# Patient Record
Sex: Female | Born: 1970
Health system: Southern US, Community
[De-identification: ages and names within clinical notes are randomized; demographics above are authoritative.]

## PROBLEM LIST (undated history)

## (undated) DIAGNOSIS — Z87442 Personal history of urinary calculi: Secondary | ICD-10-CM

## (undated) DIAGNOSIS — J40 Bronchitis, not specified as acute or chronic: Secondary | ICD-10-CM

## (undated) DIAGNOSIS — J45909 Unspecified asthma, uncomplicated: Secondary | ICD-10-CM

## (undated) DIAGNOSIS — E042 Nontoxic multinodular goiter: Secondary | ICD-10-CM

## (undated) DIAGNOSIS — K219 Gastro-esophageal reflux disease without esophagitis: Secondary | ICD-10-CM

## (undated) DIAGNOSIS — J302 Other seasonal allergic rhinitis: Secondary | ICD-10-CM

## (undated) DIAGNOSIS — K209 Esophagitis, unspecified without bleeding: Secondary | ICD-10-CM

## (undated) DIAGNOSIS — T7840XA Allergy, unspecified, initial encounter: Secondary | ICD-10-CM

## (undated) DIAGNOSIS — G43909 Migraine, unspecified, not intractable, without status migrainosus: Secondary | ICD-10-CM

## (undated) DIAGNOSIS — E785 Hyperlipidemia, unspecified: Secondary | ICD-10-CM

## (undated) DIAGNOSIS — E0590 Thyrotoxicosis, unspecified without thyrotoxic crisis or storm: Secondary | ICD-10-CM

## (undated) HISTORY — DX: Hyperlipidemia, unspecified: E78.5

## (undated) HISTORY — DX: Gastro-esophageal reflux disease without esophagitis: K21.9

## (undated) HISTORY — DX: Nontoxic multinodular goiter: E04.2

## (undated) HISTORY — DX: Thyrotoxicosis, unspecified without thyrotoxic crisis or storm: E05.90

## (undated) HISTORY — DX: Allergy, unspecified, initial encounter: T78.40XA

---

## 2003-09-13 HISTORY — PX: CHOLECYSTECTOMY: SHX55

## 2014-04-01 ENCOUNTER — Emergency Department
Admission: EM | Admit: 2014-04-01 | Discharge: 2014-04-01 | Disposition: A | Discharge status: Home / Self Care | Payer: 59 | Source: Home / Self Care | Attending: Family Medicine | Admitting: Family Medicine

## 2014-04-01 ENCOUNTER — Encounter: Payer: Self-pay | Admitting: Emergency Medicine

## 2014-04-01 DIAGNOSIS — B9789 Other viral agents as the cause of diseases classified elsewhere: Principal | ICD-10-CM

## 2014-04-01 DIAGNOSIS — J069 Acute upper respiratory infection, unspecified: Secondary | ICD-10-CM

## 2014-04-01 DIAGNOSIS — J209 Acute bronchitis, unspecified: Secondary | ICD-10-CM

## 2014-04-01 HISTORY — DX: Other seasonal allergic rhinitis: J30.2

## 2014-04-01 HISTORY — DX: Unspecified asthma, uncomplicated: J45.909

## 2014-04-01 HISTORY — DX: Bronchitis, not specified as acute or chronic: J40

## 2014-04-01 HISTORY — DX: Migraine, unspecified, not intractable, without status migrainosus: G43.909

## 2014-04-01 MED ORDER — AZITHROMYCIN 250 MG PO TABS: ORAL_TABLET | ORAL | Status: DC

## 2014-04-01 MED ORDER — PREDNISONE 20 MG PO TABS: 20.0000 mg | ORAL_TABLET | Freq: Two times a day (BID) | ORAL | Status: DC

## 2014-04-01 MED ORDER — BENZONATATE 200 MG PO CAPS: 200.0000 mg | ORAL_CAPSULE | Freq: Every day | ORAL | Status: DC

## 2014-04-01 NOTE — Discharge Instructions (Signed)
Continue plain Mucinex (1200 mg guaifenesin) twice daily for cough and congestion.  May add Sudafed for sinus congestion.   Increase fluid intake, rest. May use Afrin nasal spray (or generic oxymetazoline) twice daily for about 5 days.  Also recommend using saline nasal spray several times daily and saline nasal irrigation (AYR is a common brand).  Continue Flonase nasal spray daily after Afrin and saline rinse. Try warm salt water gargles for sore throat.  Stop all antihistamines for now, and other non-prescription cough/cold preparations.

## 2014-04-01 NOTE — ED Provider Notes (Signed)
CSN: [id number]     Arrival date & time 04/01/14  1840 History   First MD Initiated Contact with Patient 04/01/14 1900     Chief Complaint  Patient presents with  . Cough  . Nasal Congestion      HPI Comments: Patient complains of sinus congestion about one week ago that did not respond to Zyrtec and Flonase.  About 6 days ago she developed a non-productive cough and post-nasal drainage but no sore throat.  She has had wheezing, improved with her albuterol inhaler.  She denies fevers, chills, and sweats. She has a history of allergy induced asthma that only flares up seasonally and with viral URI  The history is provided by the patient.    Past Medical History  Diagnosis Date  . Asthma   . Seasonal allergies   . Bronchitis   . Migraine    Past Surgical History  Procedure Laterality Date  . Cholecystectomy     History reviewed. No pertinent family history. History  Substance Use Topics  . Smoking status: Former Research scientist (life sciences)  . Smokeless tobacco: Not on file  . Alcohol Use: Yes   OB History   Grav Para Term Preterm Abortions TAB SAB Ect Mult Living                 Review of Systems No sore throat + hoarseness + cough No pleuritic pain + wheezing + nasal congestion + post-nasal drainage No sinus pain/pressure No itchy/red eyes No earache No hemoptysis No SOB No fever/chills No nausea No vomiting No abdominal pain No diarrhea No urinary symptoms No skin rash + fatigue No myalgias No headache Used OTC meds without relief  Allergies  Review of patient's allergies indicates no known allergies.  Home Medications   Prior to Admission medications   Medication Sig Start Date End Date Taking? Authorizing Provider  albuterol (PROVENTIL) (2.5 MG/3ML) 0.083% nebulizer solution Take 2.5 mg by nebulization every 6 (six) hours as needed for wheezing or shortness of breath.   Yes Historical Provider, MD  cetirizine (ZYRTEC) 10 MG tablet Take 10 mg by mouth daily.   Yes  Historical Provider, MD  fluticasone (FLONASE) 50 MCG/ACT nasal spray Place into both nostrils daily.   Yes Historical Provider, MD  azithromycin (ZITHROMAX Z-PAK) 250 MG tablet Take 2 tabs today; then begin one tab once daily for 4 more days. 04/01/14   Kandra Nicolas, MD  benzonatate (TESSALON) 200 MG capsule Take 1 capsule (200 mg total) by mouth at bedtime. Take as needed for cough 04/01/14   Kandra Nicolas, MD  predniSONE (DELTASONE) 20 MG tablet Take 1 tablet (20 mg total) by mouth 2 (two) times daily. Take with food. 04/01/14   Kandra Nicolas, MD   BP 124/73  Pulse 75  Temp(Src) 98 F (36.7 C) (Oral)  Resp 16  Ht 5\' 4"  (1.626 m)  Wt 145 lb (65.772 kg)  BMI 24.88 kg/m2  SpO2 96%  LMP 03/12/2014 Physical Exam Nursing notes and Vital Signs reviewed. Appearance:  Patient appears healthy, stated age, and in no acute distress Eyes:  Pupils are equal, round, and reactive to light and accomodation.  Extraocular movement is intact.  Conjunctivae are not inflamed  Ears:  Canals normal.  Tympanic membranes normal.  Nose:   Congested turbinates.  No sinus tenderness.   Pharynx:  Normal Neck:  Supple.   Tender enlarged posterior nodes are palpated bilaterally  Lungs:  Faint wheezes right chest; no rales or rhonchi.  Breath sounds are equal.  Heart:  Regular rate and rhythm without murmurs, rubs, or gallops.  Abdomen:  Nontender without masses or hepatosplenomegaly.  Bowel sounds are present.  No CVA or flank tenderness.  Extremities:  No edema.  No calf tenderness Skin:  No rash present.   ED Course  Procedures  none      MDM   1. Viral URI with cough   2. Bronchospasm with bronchitis, acute     Begin prednisone burst and Z-pack to cover atypicals. Prescription written for Benzonatate [facility]) to take at bedtime for night-time cough.  Continue plain Mucinex (1200 mg guaifenesin) twice daily for cough and congestion.  May add Sudafed for sinus congestion.   Increase fluid intake,  rest. May use Afrin nasal spray (or generic oxymetazoline) twice daily for about 5 days.  Also recommend using saline nasal spray several times daily and saline nasal irrigation (AYR is a common brand).  Continue Flonase nasal spray daily after Afrin and saline rinse. Try warm salt water gargles for sore throat.  Stop all antihistamines for now, and other non-prescription cough/cold preparations. Followup with Family Doctor if not improved in one week.     Kandra Nicolas, MD 04/02/14 [phone]

## 2014-04-01 NOTE — ED Notes (Signed)
Reports one week history of cough and congestion; worried about possibility of bronchitis.

## 2014-04-04 ENCOUNTER — Telehealth: Payer: Self-pay | Admitting: Emergency Medicine

## 2014-08-18 ENCOUNTER — Other Ambulatory Visit: Payer: Self-pay | Admitting: Obstetrics and Gynecology

## 2014-08-18 DIAGNOSIS — Z1231 Encounter for screening mammogram for malignant neoplasm of breast: Secondary | ICD-10-CM

## 2014-08-27 ENCOUNTER — Ambulatory Visit (INDEPENDENT_AMBULATORY_CARE_PROVIDER_SITE_OTHER): Payer: 59

## 2014-08-27 DIAGNOSIS — Z1231 Encounter for screening mammogram for malignant neoplasm of breast: Secondary | ICD-10-CM

## 2015-02-13 ENCOUNTER — Other Ambulatory Visit: Payer: Self-pay | Admitting: Internal Medicine

## 2015-02-13 DIAGNOSIS — E041 Nontoxic single thyroid nodule: Secondary | ICD-10-CM

## 2015-02-17 ENCOUNTER — Ambulatory Visit (INDEPENDENT_AMBULATORY_CARE_PROVIDER_SITE_OTHER): Payer: 59

## 2015-02-17 DIAGNOSIS — E041 Nontoxic single thyroid nodule: Secondary | ICD-10-CM

## 2015-03-27 ENCOUNTER — Other Ambulatory Visit (HOSPITAL_COMMUNITY): Payer: Self-pay | Admitting: Internal Medicine

## 2015-03-27 DIAGNOSIS — E041 Nontoxic single thyroid nodule: Secondary | ICD-10-CM

## 2015-04-01 ENCOUNTER — Ambulatory Visit (HOSPITAL_COMMUNITY): Payer: 59

## 2015-04-22 ENCOUNTER — Other Ambulatory Visit: Payer: Self-pay | Admitting: Radiology

## 2015-04-23 ENCOUNTER — Ambulatory Visit (HOSPITAL_COMMUNITY)
Admission: RE | Admit: 2015-04-23 | Discharge: 2015-04-23 | Disposition: A | Discharge status: Home / Self Care | Payer: 59 | Source: Ambulatory Visit | Attending: Internal Medicine | Admitting: Internal Medicine

## 2015-04-23 DIAGNOSIS — E042 Nontoxic multinodular goiter: Secondary | ICD-10-CM | POA: Insufficient documentation

## 2015-04-23 DIAGNOSIS — E041 Nontoxic single thyroid nodule: Secondary | ICD-10-CM

## 2015-04-23 NOTE — Procedures (Signed)
US guided FNA X4 dominant right mid thyroid nodule via 25 gauge needles. Pathology pending. No immediate complications.

## 2015-11-13 MED FILL — methIMAzole 10 MG TABS: 10 | 90 days supply | Qty: 102 | Fill #1

## 2015-11-13 MED FILL — LO LOESTRIN FE 1-10 TABLET: 1 MG-10 MCG | 84 days supply | Qty: 84 | Fill #1

## 2016-02-09 MED FILL — LO LOESTRIN FE 1-10 TABLET: 1 MG-10 MCG | 84 days supply | Qty: 84 | Fill #2

## 2016-02-09 MED FILL — methIMAzole 10 MG TABS: 10 | 90 days supply | Qty: 102 | Fill #2

## 2016-05-06 DIAGNOSIS — Z135 Encounter for screening for eye and ear disorders: Secondary | ICD-10-CM | POA: Diagnosis not present

## 2016-05-06 DIAGNOSIS — H5213 Myopia, bilateral: Secondary | ICD-10-CM | POA: Diagnosis not present

## 2016-05-12 MED FILL — LO LOESTRIN FE 1-10 TABLET: 1 MG-10 MCG | 84 days supply | Qty: 84 | Fill #3

## 2016-05-13 MED FILL — TRIAZOLAM 0.25 MG TABLET: 0.25 | 2 days supply | Qty: 2 | Fill #0

## 2016-09-02 ENCOUNTER — Telehealth: Payer: Self-pay | Admitting: Allergy & Immunology

## 2016-09-02 MED ORDER — AMOXICILLIN-POT CLAVULANATE 875-125 MG PO TABS: 1.0000 | ORAL_TABLET | Freq: Two times a day (BID) | ORAL | 0 refills | Status: AC

## 2016-09-02 NOTE — Telephone Encounter (Signed)
Leslie Valencia talked to me to discuss worsening sinus symptoms. She has had a cold for 1-2 weeks and was feeling improved, however her symptoms acutely worsened overnight. She endorses sinus pressure in bilateral frontal sinuses as well as as green purulent discharge with postnasal drip. She has no history of recurrent sinusitis and she cannot remember when her last course of abx was prescribed. Therefore I will send in a prescription for Augmentin 875mg  tablets twice daily for 14 days. Confirmed pharmacy. Patient has NKDA.  Salvatore Marvel, MD [place] [facility] [place]

## 2016-10-11 MED FILL — methIMAzole 10 MG TABS: 10 | 90 days supply | Qty: 104 | Fill #0

## 2016-11-01 DIAGNOSIS — Z6830 Body mass index (BMI) 30.0-30.9, adult: Secondary | ICD-10-CM | POA: Diagnosis not present

## 2016-11-01 DIAGNOSIS — Z01419 Encounter for gynecological examination (general) (routine) without abnormal findings: Secondary | ICD-10-CM | POA: Diagnosis not present

## 2017-01-17 MED FILL — methIMAzole 10 MG TABS: 10 | 26 days supply | Qty: 31 | Fill #1

## 2017-03-09 DIAGNOSIS — E0590 Thyrotoxicosis, unspecified without thyrotoxic crisis or storm: Secondary | ICD-10-CM | POA: Diagnosis not present

## 2017-03-09 DIAGNOSIS — E041 Nontoxic single thyroid nodule: Secondary | ICD-10-CM | POA: Diagnosis not present

## 2017-03-09 DIAGNOSIS — Z0000 Encounter for general adult medical examination without abnormal findings: Secondary | ICD-10-CM | POA: Diagnosis not present

## 2017-03-10 ENCOUNTER — Other Ambulatory Visit: Payer: Self-pay

## 2017-03-10 MED ORDER — MONTELUKAST SODIUM 10 MG PO TABS: 10.0000 mg | ORAL_TABLET | Freq: Every day | ORAL | 5 refills | Status: DC

## 2017-03-27 ENCOUNTER — Other Ambulatory Visit: Payer: Self-pay

## 2017-03-27 MED ORDER — MONTELUKAST SODIUM 10 MG PO TABS: 10.0000 mg | ORAL_TABLET | Freq: Every day | ORAL | 5 refills | Status: DC

## 2017-03-27 MED FILL — MONTELUKAST SOD 10 MG TAB: 10 | 90 days supply | Qty: 90 | Fill #0

## 2017-03-28 MED FILL — methIMAzole 10 MG TABS: 10 | 84 days supply | Qty: 96 | Fill #0

## 2017-05-05 ENCOUNTER — Telehealth: Payer: Self-pay | Admitting: Allergy & Immunology

## 2017-05-05 MED ORDER — TRIAMCINOLONE ACETONIDE 0.5 % EX CREA: 1.0000 "application " | TOPICAL_CREAM | Freq: Three times a day (TID) | CUTANEOUS | 0 refills | Status: DC

## 2017-05-05 MED ORDER — LIDOCAINE VISCOUS 2 % MT SOLN: 20.0000 mL | OROMUCOSAL | 0 refills | Status: DC | PRN

## 2017-05-05 NOTE — Telephone Encounter (Signed)
Patient called requesting refills for a topical steroid and oral lidocaine solution. Prescriptions sent in.  Salvatore Marvel, MD [facility] and [place] [facility] [place]

## 2017-06-12 ENCOUNTER — Telehealth: Payer: Self-pay | Admitting: Allergy & Immunology

## 2017-06-12 MED ORDER — AMOXICILLIN-POT CLAVULANATE 875-125 MG PO TABS: 1.0000 | ORAL_TABLET | Freq: Two times a day (BID) | ORAL | 0 refills | Status: AC

## 2017-06-12 NOTE — Telephone Encounter (Signed)
Ms. Coover called me to discuss acute onset sinus pressure and fever (100.8). She endorses sinus pressure in bilateral frontal sinuses as well as as green purulent discharge with postnasal drip. She has no history of recurrent sinusitis. Her last course of sinusitis was in December 2017. Therefore I will send in a prescription for Augmentin 875mg  tablets twice daily for 14 days. Confirmed pharmacy. Patient has NKDA.  Salvatore Marvel, MD [place] [facility] [place]

## 2017-06-15 DIAGNOSIS — H5213 Myopia, bilateral: Secondary | ICD-10-CM | POA: Diagnosis not present

## 2017-07-14 MED FILL — methIMAzole 10 MG TABS: 10 | 84 days supply | Qty: 96 | Fill #1

## 2017-08-17 ENCOUNTER — Other Ambulatory Visit: Payer: Self-pay

## 2017-08-17 MED ORDER — AMOXICILLIN-POT CLAVULANATE 875-125 MG PO TABS: 1.0000 | ORAL_TABLET | Freq: Two times a day (BID) | ORAL | 0 refills | Status: AC

## 2017-10-24 MED FILL — methIMAzole 10 MG TABS: 10 | 84 days supply | Qty: 96 | Fill #2

## 2017-11-09 ENCOUNTER — Telehealth: Payer: Self-pay | Admitting: Allergy & Immunology

## 2017-11-09 MED ORDER — AMOXICILLIN-POT CLAVULANATE 875-125 MG PO TABS: 1.0000 | ORAL_TABLET | Freq: Two times a day (BID) | ORAL | 0 refills | Status: AC

## 2017-11-09 MED FILL — AMOX TR-K CLV 875-125 MG TA: 875-125 | 10 days supply | Qty: 20 | Fill #0

## 2017-11-09 NOTE — Telephone Encounter (Signed)
Patient called to let me know of worsening sinus pressure and nasal discharge. Symptoms improved and then worsened acutely. Last abx course was Augmentin in October 2018. I will send in another course of this. Patient in agreement with the plan.   Salvatore Marvel, MD [facility] and [place] [facility] [place]

## 2017-11-10 MED ORDER — PREDNISONE 10 MG PO TABS: ORAL_TABLET | ORAL | 0 refills | Status: DC

## 2017-11-10 NOTE — Addendum Note (Signed)
Addended by: Valentina Shaggy on: 11/10/2017 12:51 PM   Modules accepted: Orders

## 2017-12-05 DIAGNOSIS — Z0000 Encounter for general adult medical examination without abnormal findings: Secondary | ICD-10-CM | POA: Diagnosis not present

## 2017-12-05 DIAGNOSIS — Z6830 Body mass index (BMI) 30.0-30.9, adult: Secondary | ICD-10-CM | POA: Diagnosis not present

## 2017-12-05 DIAGNOSIS — Z01419 Encounter for gynecological examination (general) (routine) without abnormal findings: Secondary | ICD-10-CM | POA: Diagnosis not present

## 2017-12-05 LAB — CBC AND DIFFERENTIAL
HCT: 45 (ref 36–46)
Hemoglobin: 15.7 (ref 12.0–16.0)
Platelets: 228 (ref 150–399)
WBC: 5.7

## 2017-12-05 LAB — HM PAP SMEAR

## 2017-12-05 LAB — HEPATIC FUNCTION PANEL
ALT: 30 (ref 7–35)
AST: 21 (ref 13–35)

## 2017-12-05 LAB — CBC: RBC: 4.84 (ref 3.87–5.11)

## 2017-12-05 LAB — BASIC METABOLIC PANEL
BUN: 9 (ref 4–21)
Creatinine: 0.8 (ref 0.5–1.1)

## 2017-12-05 LAB — LIPID PANEL
Cholesterol: 310 — AB (ref 0–200)
HDL: 67 (ref 35–70)
LDL Cholesterol: 210
Triglycerides: 166 — AB (ref 40–160)

## 2017-12-05 LAB — TSH: TSH: 1.3 (ref 0.41–5.90)

## 2018-01-22 MED FILL — methIMAzole 10 MG TABS: 10 | 84 days supply | Qty: 96 | Fill #3

## 2018-02-14 ENCOUNTER — Telehealth: Payer: Self-pay | Admitting: Allergy & Immunology

## 2018-02-14 DIAGNOSIS — E785 Hyperlipidemia, unspecified: Secondary | ICD-10-CM

## 2018-02-14 DIAGNOSIS — B999 Unspecified infectious disease: Secondary | ICD-10-CM

## 2018-02-14 DIAGNOSIS — J310 Chronic rhinitis: Secondary | ICD-10-CM

## 2018-02-14 MED ORDER — AMOXICILLIN-POT CLAVULANATE 875-125 MG PO TABS: 1.0000 | ORAL_TABLET | Freq: Two times a day (BID) | ORAL | 0 refills | Status: AC

## 2018-02-14 NOTE — Telephone Encounter (Signed)
Ms. Bacorn contacted me to let me know that she is having two weeks of marked congestion, sinus pressure, and a brain fog. She is using all of the OTC treatments including nasal saline rinses without improvement in her symptoms.   Last abx course was Augmentin in February 2019. I will send in another course of Augmentin 875mg  for ten days. I will also order environmental allergy blood work.   Salvatore Marvel, MD [facility] and [place] [facility] [place]

## 2018-02-22 NOTE — Telephone Encounter (Signed)
Patient reports that she completed her antibiotic and she is not having any improvement. She reports clear discharge with some sinus pressure. We discuss options and she decided to go with the addition of a nasal steroid. I also ordered some screening labs to rule out immune deficiencies.  Salvatore Marvel, MD [facility] [place] [facility] [place]

## 2018-02-22 NOTE — Addendum Note (Signed)
Addended by: Valentina Shaggy on: 02/22/2018 12:47 PM   Modules accepted: Orders

## 2018-03-08 NOTE — Telephone Encounter (Signed)
Placed order for repeat lipid panel to be collected at the same time as the other labs.   Salvatore Marvel, MD [facility] and [place] [facility] [place]

## 2018-03-08 NOTE — Addendum Note (Signed)
Addended by: Valentina Shaggy on: 03/08/2018 01:36 PM   Modules accepted: Orders

## 2018-03-13 DIAGNOSIS — E785 Hyperlipidemia, unspecified: Secondary | ICD-10-CM | POA: Diagnosis not present

## 2018-03-21 LAB — IGE+ALLERGENS ZONE 2(30)
Alternaria Alternata IgE: <0.1 kU/L
Amer Sycamore IgE Qn: <0.1 kU/L
Aspergillus Fumigatus IgE: <0.1 kU/L
Bermuda Grass IgE: <0.1 kU/L
Cat Dander IgE: <0.1 kU/L
Cedar, Mountain IgE: <0.1 kU/L
Cladosporium Herbarum IgE: <0.1 kU/L
Cockroach, American IgE: <0.1 kU/L
D Farinae IgE: 0.26 kU/L — AB
D Pteronyssinus IgE: 0.21 kU/L — AB
Dog Dander IgE: <0.1 kU/L
Elm, American IgE: <0.1 kU/L
Hickory, White IgE: <0.1 kU/L
IGE (IMMUNOGLOBULIN E), SERUM: 10 [IU]/mL (ref 6–495)
Maple/Box Elder IgE: <0.1 kU/L
Mucor Racemosus IgE: <0.1 kU/L
Mugwort IgE Qn: <0.1 kU/L
Nettle IgE: <0.1 kU/L
Oak, White IgE: <0.1 kU/L
Penicillium Chrysogen IgE: <0.1 kU/L
Pigweed, Rough IgE: <0.1 kU/L
Plantain, English IgE: <0.1 kU/L
Sheep Sorrel IgE Qn: <0.1 kU/L
Sweet gum IgE RAST Ql: <0.1 kU/L
White Mulberry IgE: <0.1 kU/L

## 2018-03-21 LAB — STREP PNEUMONIAE 23 SEROTYPES IGG
PNEUMO AB TYPE 34 (10A): 0.4 ug/mL — AB (ref >1.3)
PNEUMO AB TYPE 43 (11A): 1.1 ug/mL — AB (ref >1.3)
Pneumo Ab Type 1*: <0.1 ug/mL — ABNORMAL LOW (ref >1.3)
Pneumo Ab Type 14*: 0.7 ug/mL — ABNORMAL LOW (ref >1.3)
Pneumo Ab Type 17 (17F)*: 0.2 ug/mL — ABNORMAL LOW (ref >1.3)
Pneumo Ab Type 19 (19F)*: 0.6 ug/mL — ABNORMAL LOW (ref >1.3)
Pneumo Ab Type 20*: 0.2 ug/mL — ABNORMAL LOW (ref >1.3)
Pneumo Ab Type 22 (22F)*: 0.2 ug/mL — ABNORMAL LOW (ref >1.3)
Pneumo Ab Type 23 (23F)*: 0.9 ug/mL — ABNORMAL LOW (ref >1.3)
Pneumo Ab Type 26 (6B)*: <0.1 ug/mL — ABNORMAL LOW (ref >1.3)
Pneumo Ab Type 3*: 0.4 ug/mL — ABNORMAL LOW (ref >1.3)
Pneumo Ab Type 5*: <0.2 ug/mL — ABNORMAL LOW (ref >1.3)
Pneumo Ab Type 51 (7F)*: 0.1 ug/mL — ABNORMAL LOW (ref >1.3)
Pneumo Ab Type 54 (15B)*: 0.4 ug/mL — ABNORMAL LOW (ref >1.3)
Pneumo Ab Type 56 (18C)*: 0.3 ug/mL — ABNORMAL LOW (ref >1.3)
Pneumo Ab Type 57 (19A)*: 0.6 ug/mL — ABNORMAL LOW (ref >1.3)
Pneumo Ab Type 70 (33F)*: 0.1 ug/mL — ABNORMAL LOW (ref >1.3)
Pneumo Ab Type 8*: <0.1 ug/mL — ABNORMAL LOW (ref >1.3)
Pneumo Ab Type 9 (9N)*: <0.1 ug/mL — ABNORMAL LOW (ref >1.3)

## 2018-03-21 LAB — LIPID PANEL
CHOLESTEROL TOTAL: 293 mg/dL — AB (ref 100–199)
Chol/HDL Ratio: 4.5 ratio — ABNORMAL HIGH (ref 0.0–4.4)
HDL: 65 mg/dL (ref >39)
LDL CALC: 192 mg/dL — AB (ref 0–99)
Triglycerides: 179 mg/dL — ABNORMAL HIGH (ref 0–149)
VLDL Cholesterol Cal: 36 mg/dL (ref 5–40)

## 2018-03-21 LAB — IGG, IGA, IGM
IGG (IMMUNOGLOBIN G), SERUM: 751 mg/dL (ref 700–1600)
IGM (IMMUNOGLOBULIN M), SRM: 110 mg/dL (ref 26–217)
IgA/Immunoglobulin A, Serum: 70 mg/dL — ABNORMAL LOW (ref 87–352)

## 2018-03-21 LAB — DIPHTHERIA / TETANUS ANTIBODY PANEL: Tetanus Ab, IgG: 1.65 IU/mL (ref <0.10)

## 2018-03-21 MED ORDER — LIDOCAINE VISCOUS HCL 2 % MT SOLN: 15.0000 mL | OROMUCOSAL | 2 refills | Status: DC | PRN

## 2018-03-21 NOTE — Addendum Note (Signed)
Addended by: Valentina Shaggy on: 03/21/2018 09:40 AM   Modules accepted: Orders

## 2018-04-12 ENCOUNTER — Ambulatory Visit: Payer: 59 | Admitting: Family Medicine

## 2018-04-12 ENCOUNTER — Encounter: Payer: Self-pay | Admitting: Family Medicine

## 2018-04-12 VITALS — BP 117/85 | HR 79 | Temp 98.7°F | Resp 20 | Ht 64.0 in | Wt 154.5 lb

## 2018-04-12 DIAGNOSIS — E663 Overweight: Secondary | ICD-10-CM

## 2018-04-12 DIAGNOSIS — E0590 Thyrotoxicosis, unspecified without thyrotoxic crisis or storm: Secondary | ICD-10-CM | POA: Diagnosis not present

## 2018-04-12 DIAGNOSIS — G43709 Chronic migraine without aura, not intractable, without status migrainosus: Secondary | ICD-10-CM | POA: Diagnosis not present

## 2018-04-12 DIAGNOSIS — Z7689 Persons encountering health services in other specified circumstances: Secondary | ICD-10-CM

## 2018-04-12 DIAGNOSIS — E782 Mixed hyperlipidemia: Secondary | ICD-10-CM

## 2018-04-12 DIAGNOSIS — E042 Nontoxic multinodular goiter: Secondary | ICD-10-CM

## 2018-04-12 DIAGNOSIS — Z789 Other specified health status: Secondary | ICD-10-CM | POA: Diagnosis not present

## 2018-04-12 DIAGNOSIS — R490 Dysphonia: Secondary | ICD-10-CM

## 2018-04-12 MED ORDER — AMITRIPTYLINE HCL 25 MG PO TABS: ORAL_TABLET | ORAL | 0 refills | Status: DC

## 2018-04-12 MED ORDER — ATORVASTATIN CALCIUM 20 MG PO TABS: 20.0000 mg | ORAL_TABLET | Freq: Every day | ORAL | 1 refills | Status: DC

## 2018-04-12 MED ORDER — ISOMETHEPTENE-DICHLORAL-APAP 65-100-325 MG PO CAPS: ORAL_CAPSULE | ORAL | 5 refills | Status: DC

## 2018-04-12 NOTE — Patient Instructions (Addendum)
I ordered a thyroid US--> they will call to schedule  Start liptior--> follow up with me in 3 months for lipid recheck  Start amitriptyline before bed, start 25 mg about 1 hour before bed. If after 1 week you feel you could use a higher dose (but not too sedated)--> increase to 50 mg before bed.  I will also call in [place] for you.    Please help [place] help you:  We are honored you have chosen [facility] for your Primary Care home. Below you will find basic instructions that you may need to access in the future. Please help [place] help you by reading the instructions, which cover many of the frequent questions we experience.   Prescription refills and request:  -In order to allow more efficient response time, please call your pharmacy for all refills. They will forward the request electronically to [place]. This allows for the quickest possible response. Request left on a nurse line can take longer to refill, since these are checked as time allows between office patients and other phone calls.  - refill request can take up to 3-5 working days to complete.  - If request is sent electronically and request is appropiate, it is usually completed in 1-2 business days.  - all patients will need to be seen routinely for all chronic medical conditions requiring prescription medications (see follow-up below). If you are overdue for follow up on your condition, you will be asked to make an appointment and we will call in enough medication to cover you until your appointment (up to 30 days).  - all controlled substances will require a face to face visit to request/refill.  - if you desire your prescriptions to go through a new pharmacy, and have an active script at original pharmacy, you will need to call your pharmacy and have scripts transferred to new pharmacy. This is completed between the pharmacy locations and not by your provider.    Results: If any images or labs were ordered, it can take up to 1 week to  get results depending on the test ordered and the lab/facility running and resulting the test. - Normal or stable results, which do not need further discussion, may be released to your mychart immediately with attached note to you. A call may not be generated for normal results. Please make certain to sign up for mychart. If you have questions on how to activate your mychart you can call the front office.  - If your results need further discussion, our office will attempt to contact you via phone, and if unable to reach you after 2 attempts, we will release your abnormal result to your mychart with instructions.  - All results will be automatically released in mychart after 1 week.  - Your provider will provide you with explanation and instruction on all relevant material in your results. Please keep in mind, results and labs may appear confusing or abnormal to the untrained eye, but it does not mean they are actually abnormal for you personally. If you have any questions about your results that are not covered, or you desire more detailed explanation than what was provided, you should make an appointment with your provider to do so.   Our office handles many outgoing and incoming calls daily. If we have not contacted you within 1 week about your results, please check your mychart to see if there is a message first and if not, then contact our office.  In helping with  this matter, you help decrease call volume, and therefore allow [place] to be able to respond to patients needs more efficiently.   Acute office visits (sick visit):  An acute visit is intended for a new problem and are scheduled in shorter time slots to allow schedule openings for patients with new problems. This is the appropriate visit to discuss a new problem. Problems will not be addressed by phone call or Echart message. Appointment is needed if requesting treatment. In order to provide you with excellent quality medical care with proper time  for you to explain your problem, have an exam and receive treatment with instructions, these appointments should be limited to one new problem per visit. If you experience a new problem, in which you desire to be addressed, please make an acute office visit, we save openings on the schedule to accommodate you. Please do not save your new problem for any other type of visit, let us take care of it properly and quickly for you.   Follow up visits:  Depending on your condition(s) your provider will need to see you routinely in order to provide you with quality care and prescribe medication(s). Most chronic conditions (Example: hypertension, Diabetes, depression/anxiety... etc), require visits a couple times a year. Your provider will instruct you on proper follow up for your personal medical conditions and history. Please make certain to make follow up appointments for your condition as instructed. Failing to do so could result in lapse in your medication treatment/refills. If you request a refill, and are overdue to be seen on a condition, we will always provide you with a 30 day script (once) to allow you time to schedule.    Medicare wellness (well visit): - we have a wonderful Nurse Maudie Mercury), that will meet with you and provide you will yearly medicare wellness visits. These visits should occur yearly (can not be scheduled less than 1 calendar year apart) and cover preventive health, immunizations, advance directives and screenings you are entitled to yearly through your medicare benefits. Do not miss out on your entitled benefits, this is when medicare will pay for these benefits to be ordered for you.  These are strongly encouraged by your provider and is the appropriate type of visit to make certain you are up to date with all preventive health benefits. If you have not had your medicare wellness exam in the last 12 months, please make certain to schedule one by calling the office and schedule your medicare  wellness with Maudie Mercury as soon as possible.   Yearly physical (well visit):  - Adults are recommended to be seen yearly for physicals. Check with your insurance and date of your last physical, most insurances require one calendar year between physicals. Physicals include all preventive health topics, screenings, medical exam and labs that are appropriate for gender/age and history. You may have fasting labs needed at this visit. This is a well visit (not a sick visit), new problems should not be covered during this visit (see acute visit).  - Pediatric patients are seen more frequently when they are younger. Your provider will advise you on well child visit timing that is appropriate for your their age. - This is not a medicare wellness visit. Medicare wellness exams do not have an exam portion to the visit. Some medicare companies allow for a physical, some do not allow a yearly physical. If your medicare allows a yearly physical you can schedule the medicare wellness with our nurse Maudie Mercury and have your  physical with your provider after, on the same day. Please check with insurance for your full benefits.   Late Policy/No Shows:  - all new patients should arrive 15-30 minutes earlier than appointment to allow [place] time  to  obtain all personal demographics,  insurance information and for you to complete office paperwork. - All established patients should arrive 10-15 minutes earlier than appointment time to update all information and be checked in .  - In our best efforts to run on time, if you are late for your appointment you will be asked to either reschedule or if able, we will work you back into the schedule. There will be a wait time to work you back in the schedule,  depending on availability.  - If you are unable to make it to your appointment as scheduled, please call 24 hours ahead of time to allow [place] to fill the time slot with someone else who needs to be seen. If you do not cancel your appointment  ahead of time, you may be charged a no show fee.

## 2018-04-12 NOTE — Progress Notes (Signed)
[name], female  DOB: 10-May-1971, 47 y.o.   MRN: [id number] Patient Care Team    Relationship Specialty Notifications Start End  Ma Hillock, DO PCP - General Family Medicine  04/12/18   Charisse March, MD Referring Physician Obstetrics and Gynecology  04/12/18   Sarina Ser, MD Referring Physician Endocrinology  04/12/18     Chief Complaint  Patient presents with  . Establish Care  . Hyperlipidemia  . Migraine    focal pain left side of head    Subjective:  Leslie Valencia is a 47 y.o.  female present for new patient establishment. All past medical history, surgical history, allergies, family history, immunizations, medications and social history were updated in the electronic medical record today. All recent labs, ED visits and hospitalizations within the last year were reviewed.  Migraine: Pt reports a long history of migraines. She has gone through many different medications and can not tolerate narcotics of any type. Anaphylaxis to some. She was tried on topamax a long time ago, but it was not effective. She has 3-5 headaches a week. Usually can get by with MSAIDS if taken soon enough. She has also used Midrin in the past and it was helpful for bad migraines. She endorses also experiencing more recenlty occasion left posterior auricular/occiptal sharp stabbing pan that only last 2-3 seconds. She denies nausea, vomit, photophobia. She does not currently have a migraine.   Hyperlipidemia: Elevated panel x3. Last below. Reviewed prior in care everywhere. She has made a great deal of changes in her diet. Went mostly vegetarian. Watches saturated fats. Exercised. Started omega-3 (vegan) 900 mg.  She reports her LDL only came down 18 points. Fhx of heart disease in [place]. Former smoker.  Lipid Panel     Component Value Date/Time   CHOL 293 (H) 03/13/2018 0930   TRIG 179 (H) 03/13/2018 0930   HDL 65 03/13/2018 0930   CHOLHDL 4.5 (H) 03/13/2018 0930   LDLCALC 192 (H) 03/13/2018 0930     Hyperthyroidism/thyroid cyst/multinodular goiter.:  Reviewed in care everywhere her last endocrine appt. She is currently on tapazole 5 mg.  Her endocrinologist is retiring and she will eventually need a referral. Today she states her neck is more swollen. She feels the nodules/cyst has grown a great deal since work up 3 years ago. Her thyroid panel at her GYN office 5 months ago pt reports was normal. She is complaining of voice changes and hoarseness. She feels her thyroid is pressing on her throat.Marland Kitchen History copied from last Endo appt: " Suppressed TSH of 0.09, normal T4 of 8.5 on 02/13/15. Negative thyroid peroxidase antibody of 14 (0-34), negative thyroglobulin  antibody, negative thyrotropin receptor antibody. Thyroid US 02/2015: large Rt lobe, 7.1 cm in length, total volume  18.2 cc, with a large 2.6-cm mass in the lower pole; normal  sized Lt lobe, 2.6 cm in length, total volume 3.3 cc. US-guided FNA 02/2015 at [facility], benign.  Methimazole since 02/16/15."   Depression screen PHQ 2/9 04/12/2018  Decreased Interest 0  Down, Depressed, Hopeless 0  PHQ - 2 Score 0   No flowsheet data found.    Current Exercise Habits: Home exercise routine, Type of exercise: walking(swimming,running), Time (Minutes): 60, Frequency (Times/Week): 4, Weekly Exercise (Minutes/Week): 240, Intensity: Moderate Exercise limited by: None identified Fall Risk  04/12/2018  Falls in the past year? No      There is no immunization history on file for this patient.  No exam data present  Past Medical History:  Diagnosis Date  . Allergy   . Asthma   . Bronchitis   . GERD (gastroesophageal reflux disease)   . Hyperlipidemia   . Hyperthyroidism   . Migraine   . Multinodular goiter   . Seasonal allergies    Allergies  Allergen Reactions  . Morphine And Related Anaphylaxis  . Mushroom Extract Complex Anaphylaxis  . Norco [Hydrocodone-Acetaminophen]   .  Tramadol    Past Surgical History:  Procedure Laterality Date  . CHOLECYSTECTOMY  2005   Family History  Problem Relation Age of Onset  . Alcohol abuse Father   . Arthritis Father   . Depression Father   . Early death Father   . Heart disease Maternal Grandmother   . Hyperlipidemia Maternal Grandmother    Social History   Socioeconomic History  . Marital status: Married    Spouse name: Not on file  . Number of children: Not on file  . Years of education: Not on file  . Highest education level: Not on file  Occupational History  . Not on file  Social Needs  . Financial resource strain: Not on file  . Food insecurity:    Worry: Not on file    Inability: Not on file  . Transportation needs:    Medical: Not on file    Non-medical: Not on file  Tobacco Use  . Smoking status: Former Research scientist (life sciences)  . Smokeless tobacco: Never Used  Substance and Sexual Activity  . Alcohol use: Yes  . Drug use: No  . Sexual activity: Yes    Partners: Male    Comment: married  Lifestyle  . Physical activity:    Days per week: Not on file    Minutes per session: Not on file  . Stress: Not on file  Relationships  . Social connections:    Talks on phone: Not on file    Gets together: Not on file    Attends religious service: Not on file    Active member of club or organization: Not on file    Attends meetings of clubs or organizations: Not on file    Relationship status: Not on file  . Intimate partner violence:    Fear of current or ex partner: Not on file    Emotionally abused: Not on file    Physically abused: Not on file    Forced sexual activity: Not on file  Other Topics Concern  . Not on file  Social History Narrative   Marital status/children/pets: married, 2 children.    Education/employment: Programmer, applications:      -Wears a bicycle helmet riding a bike: Yes     -smoke alarm in the home:Yes     - wears seatbelt: Yes     - Feels safe in their relationships: Yes   Allergies as of  04/12/2018      Reactions   Morphine And Related Anaphylaxis   Mushroom Extract Complex Anaphylaxis   Norco [hydrocodone-acetaminophen]    Tramadol       Medication List        Accurate as of 04/12/18 12:52 PM. Always use your most recent med list.          amitriptyline 25 MG tablet Commonly known as:  ELAVIL 25 mg QHS for 7 days, then 50 mg QHS   atorvastatin 20 MG tablet Commonly known as:  LIPITOR Take 1 tablet (20 mg total) by mouth daily.  cetirizine 10 MG tablet Commonly known as:  ZYRTEC Take 10 mg by mouth daily.   fexofenadine 60 MG tablet Commonly known as:  ALLEGRA Take 60 mg by mouth 2 (two) times daily.   isometheptene-acetaminophen-dichloralphenazone 65-100-325 MG capsule Commonly known as:  MIDRIN 1 cap at onset of headache. May repeat dose in 1 hour. Max: 5 caps in 24 hours   lidocaine 2 % solution Commonly known as:  XYLOCAINE Use as directed 15 mLs in the mouth or throat as needed for mouth pain.   methimazole 5 MG tablet Commonly known as:  TAPAZOLE Take 5 mg by mouth 3 (three) times daily.   omeprazole 20 MG capsule Commonly known as:  PRILOSEC Take 20 mg by mouth daily.   triamcinolone cream 0.5 % Commonly known as:  KENALOG Apply 1 application topically 3 (three) times daily.       All past medical history, surgical history, allergies, family history, immunizations andmedications were updated in the EMR today and reviewed under the history and medication portions of their EMR.    Recent Results (from the past 2160 hour(s))  Lipid Profile     Status: Abnormal   Collection Time: 03/13/18  9:30 AM  Result Value Ref Range   Cholesterol, Total 293 (H) 100 - 199 mg/dL   Triglycerides 179 (H) 0 - 149 mg/dL   HDL 65 >39 mg/dL   VLDL Cholesterol Cal 36 5 - 40 mg/dL   LDL Calculated 192 (H) 0 - 99 mg/dL   Comment: Comment     Comment: Possible Familial Hypercholesterolemia. FH should be suspected when fasting LDL cholesterol is above 189  mg/dL or non-HDL cholesterol is above 219 mg/dL. A family history of high cholesterol and heart disease in 1st degree relatives should be collected. J Clin Lipidol 2011;5:133-140    Chol/HDL Ratio 4.5 (H) 0.0 - 4.4 ratio    Comment:                                   T. Chol/HDL Ratio                                             Men  Women                               1/2 Avg.Risk  3.4    3.3                                   Avg.Risk  5.0    4.4                                2X Avg.Risk  9.6    7.1                                3X Avg.Risk 23.4   11.0   Allergens Zone 2     Status: Abnormal   Collection Time: 03/13/18  9:30 AM  Result Value Ref Range   Class Description Comment     Comment:  Levels of Specific IgE       Class  Description of Class     ---------------------------  -----  --------------------                    < 0.10         0         Negative            0.10 -    0.31         0/I       Equivocal/Low            0.32 -    0.55         I         Low            0.56 -    1.40         II        Moderate            1.41 -    3.90         III       High            3.91 -   19.00         IV        Very High           19.01 -  100.00         V         Very High                   >100.00         VI        Very High    IgE (Immunoglobulin E), Serum 10 6 - 495 IU/mL    Comment:               **Please note reference interval change**   D Pteronyssinus IgE 0.21 (A) Class 0/I kU/L   D Farinae IgE 0.26 (A) Class 0/I kU/L   Cat Dander IgE <0.10 Class 0 kU/L   Dog Dander IgE <0.10 Class 0 kU/L   [place] Grass IgE <0.10 Class 0 kU/L   Timothy Grass IgE <0.10 Class 0 kU/L   Johnson Grass IgE <0.10 Class 0 kU/L   Bahia Grass IgE <0.10 Class 0 kU/L   Cockroach, American IgE <0.10 Class 0 kU/L   Penicillium Chrysogen IgE <0.10 Class 0 kU/L   Cladosporium Herbarum IgE <0.10 Class 0 kU/L   Aspergillus Fumigatus IgE <0.10 Class 0 kU/L   Mucor Racemosus IgE <0.10 Class 0 kU/L    Alternaria Alternata IgE <0.10 Class 0 kU/L   Stemphylium Herbarum IgE <0.10 Class 0 kU/L   Common Silver Wendee Copp IgE <0.10 Class 0 kU/L   Oak, White IgE <0.10 Class 0 kU/L   Elm, American IgE <0.10 Class 0 kU/L   Maple/Box Elder IgE <0.10 Class 0 kU/L   Hickory, White IgE <0.10 Class 0 kU/L   Amer Sycamore IgE Qn <0.10 Class 0 kU/L   White Mulberry IgE <0.10 Class 0 kU/L   Sweet gum IgE RAST Ql <0.10 Class 0 kU/L   Cedar, [place] IgE <0.10 Class 0 kU/L   Ragweed, Short IgE <0.10 Class 0 kU/L   Mugwort IgE Qn <0.10 Class 0 kU/L   Plantain, English IgE <0.10 Class 0 kU/L   Pigweed, Rough IgE <0.10 Class 0 kU/L   Sheep Sorrel  IgE Qn <0.10 Class 0 kU/L   Nettle IgE <0.10 Class 0 kU/L  Strep pneumoniae 23 Serotypes IgG     Status: Abnormal   Collection Time: 03/13/18  9:30 AM  Result Value Ref Range   Pneumo Ab Type 1* <0.1 (L) >1.3 ug/mL   Pneumo Ab Type 3* 0.4 (L) >1.3 ug/mL   Pneumo Ab Type 4* <0.1 (L) >1.3 ug/mL   Pneumo Ab Type 8* <0.1 (L) >1.3 ug/mL   Pneumo Ab Type 9 (9N)* <0.1 (L) >1.3 ug/mL   Pneumo Ab Type 12 (81F)* <0.1 (L) >1.3 ug/mL   Pneumo Ab Type 14* 0.7 (L) >1.3 ug/mL   Pneumo Ab Type 17 (62F)* 0.2 (L) >1.3 ug/mL   Pneumo Ab Type 19 (59F)* 0.6 (L) >1.3 ug/mL   Pneumo Ab Type 2* <0.1 (L) >1.3 ug/mL   Pneumo Ab Type 20* 0.2 (L) >1.3 ug/mL   Pneumo Ab Type 22 (52F)* 0.2 (L) >1.3 ug/mL   Pneumo Ab Type 23 (520F)* 0.9 (L) >1.3 ug/mL   Pneumo Ab Type 26 (6B)* <0.1 (L) >1.3 ug/mL   Pneumo Ab Type 34 (10A)* 0.4 (L) >1.3 ug/mL   Pneumo Ab Type 43 (11A)* 1.1 (L) >1.3 ug/mL   Pneumo Ab Type 5* <0.2 (L) >1.3 ug/mL   Pneumo Ab Type 51 (20F)* 0.1 (L) >1.3 ug/mL   Pneumo Ab Type 54 (15B)* 0.4 (L) >1.3 ug/mL   Pneumo Ab Type 56 (18C)* 0.3 (L) >1.3 ug/mL   Pneumo Ab Type 57 (19A)* 0.6 (L) >1.3 ug/mL   Pneumo Ab Type 68 (9V)* <0.1 (L) >1.3 ug/mL   Pneumo Ab Type 70 (39F)* 0.1 (L) >1.3 ug/mL    Comment: *This test was developed and its performance characteristics determined by  [employer]. It has not been cleared or approved by the U.S. Food and Drug Administration.   IgG, IgA, IgM     Status: Abnormal   Collection Time: 03/13/18  9:30 AM  Result Value Ref Range   IgG (Immunoglobin G), Serum 751 700 - 1,600 mg/dL   IgA/Immunoglobulin A, Serum 70 (L) 87 - 352 mg/dL   IgM (Immunoglobulin M), Srm 110 26 - 217 mg/dL  Diphtheria / Tetanus Antibody Panel     Status: Abnormal   Collection Time: 03/13/18  9:30 AM  Result Value Ref Range   Tetanus Ab, IgG 1.65 <0.10 IU/mL    Comment:                              Interpretation:                                Non-Protective    <0.10                                Protective       >=0.10 Results for this test are for research purposes only by the assay's manufacturer.  The performance characteristics of this product have not been established.  Results should not be used as a diagnostic procedure without confirmation of the diagnosis by another medically established diagnostic product or procedure.    Diphtheria Ab <0.10 (L) <0.10 IU/mL    Comment:  Interpretation:                                Non-Protective    <0.10                                Protective       >=0.10 For research use only.     ROS: 14 pt review of systems performed and negative (unless mentioned in an HPI)  Objective: BP 117/85 (BP Location: Left Arm, Patient Position: Sitting, Cuff Size: Normal)   Pulse 79   Temp 98.7 F (37.1 C)   Resp 20   Ht 5\' 4"  (1.626 m)   Wt 154 lb 8 oz (70.1 kg)   SpO2 97%   BMI 26.52 kg/m  Gen: Afebrile. No acute distress. Nontoxic in appearance, well-developed, well-nourished,  Very pleasant caucasian female.  HENT: AT. [place]. MMM, . no Cough on exam,  hoarseness present on exam. Eyes:Pupils Equal Round Reactive to light, Extraocular movements intact,  Conjunctiva without redness, discharge or icterus. Neck/lymp/endocrine: Supple,no lymphadenopathy, severely enlarge  thyroid R>L  CV: RRR no murmur, no edema, +2/4 P posterior tibialis pulses.  Chest: CTAB, no wheeze, rhonchi or crackles.  Abd: Soft. NTND. BS present. no  Skin:Warm and well-perfused. Skin intact. Neuro/Msk:  Normal gait. PERLA. EOMi. Alert. Oriented x3.  Psych: Normal affect, dress and demeanor. Normal speech. Normal thought content and judgment.   Assessment/plan: Leslie Valencia is a 47 y.o. female present for est care.  Hyperthyroidism/Multinodular goiter/ Hoarseness New compression symptoms and hoarseness. Obtain US for comparison and likely send to surgeon for consideration on removal.  - eventually may need new endocrine as well since hers is retiring. Will await [place] results before deciding best action plan.  - Continue: methimazole (TAPAZOLE) 5 MG tablet supplied by endo - US THYROID; Future - F/U dependent on [place]   Chronic migraine without aura without status migrainosus, not intractable - discussed multiple options with her today. Given new neuritis type symptoms and 5 hdx days a week, she is willing to try amitriptyline QHS (25 -50 mg QHS tapering instruction provided).  - Midrin prescribed for bad headaches to be used sparingly. [facility] database reviewed 04/12/18 - amitriptyline (ELAVIL) 25 MG tablet; 25 mg QHS for 7 days, then 50 mg QHS  Dispense: 180 tablet; Refill: 0 - isometheptene-acetaminophen-dichloralphenazone (MIDRIN) 65-100-325 MG capsule; 1 cap at onset of headache. May repeat dose in 1 hour. Max: 5 caps in 24 hours  Dispense: 30 capsule; Refill: 5 - F/u 3 months.   Overweight (BMI 25.0-29.9)/Mixed hyperlipidemia - continue exercise and diet. Doing well.  - continue vegan Omega 3.  - start lipitor with LDL > 190.  - atorvastatin (LIPITOR) 20 MG tablet; Take 1 tablet (20 mg total) by mouth daily.  Dispense: 90 tablet; Refill: 1 - F/U 3  Mos with repeat lipid/lft with provider.      Return in about 3 months (around 07/13/2018).   Note is dictated utilizing  voice recognition software. Although note has been proof read prior to signing, occasional typographical errors still can be missed. If any questions arise, please do not hesitate to call for verification.  Electronically signed by: Howard Pouch, DO [place]

## 2018-04-17 ENCOUNTER — Telehealth: Payer: Self-pay

## 2018-04-17 MED ORDER — DICLOFENAC POTASSIUM(MIGRAINE) 50 MG PO PACK: 50.0000 mg | PACK | Freq: Once | ORAL | 2 refills | Status: DC | PRN

## 2018-04-17 NOTE — Telephone Encounter (Signed)
Inform pt Midrin is no longer made.  I have called in diclofenac powder that is to be used once (w/in 24 hours) for migraines. It usually is not covered by insurance, but we can try. Since she is allergic to many other option or has had failed therapy (imitrex and Maxalt)

## 2018-04-17 NOTE — Telephone Encounter (Signed)
Fax received from pharmacy stating that Midrin is no longer made and to please change Rx.

## 2018-04-18 NOTE — Telephone Encounter (Signed)
Message left on voice mail for patient to return call. Okay for [facility] nurse to inform patient and obtain information. See note.

## 2018-04-18 NOTE — Telephone Encounter (Signed)
Pt given information per Dr Raoul Pitch; " inform pt Midrin is no longer made. I have called in diclofenac powder that is to be used once (w/in 24 hours) for migraines.  It usually is not covered by insurance, but we can try.Since she is allergic to many other option or had failed therapy (imitrex and Maxalt)"; also informed pt the prescription was sent to [facility] [address] [place], [place] on 04/17/18; also reviewd prescription instructions with pt, " Take 50 mg by mouth once as needed for up to 1 dose"; she verbalizes understanding.

## 2018-04-25 ENCOUNTER — Other Ambulatory Visit: Payer: 59

## 2018-04-26 ENCOUNTER — Ambulatory Visit (INDEPENDENT_AMBULATORY_CARE_PROVIDER_SITE_OTHER): Payer: 59

## 2018-04-26 DIAGNOSIS — R490 Dysphonia: Secondary | ICD-10-CM

## 2018-04-26 DIAGNOSIS — E0590 Thyrotoxicosis, unspecified without thyrotoxic crisis or storm: Secondary | ICD-10-CM

## 2018-04-26 DIAGNOSIS — E042 Nontoxic multinodular goiter: Secondary | ICD-10-CM

## 2018-04-27 ENCOUNTER — Telehealth: Payer: Self-pay | Admitting: Family Medicine

## 2018-04-27 DIAGNOSIS — M542 Cervicalgia: Secondary | ICD-10-CM

## 2018-04-27 DIAGNOSIS — R490 Dysphonia: Secondary | ICD-10-CM

## 2018-04-27 DIAGNOSIS — E042 Nontoxic multinodular goiter: Secondary | ICD-10-CM

## 2018-04-27 NOTE — Telephone Encounter (Signed)
Please inform patient the following information: Her thyroid US is stable overall. The larger of her nodules is slightly larger (2.6 cm --> 2.9 cm), but appears benign and since bx was benign no further bx was recommended. However,she did report hoarseness and compression symptoms of her enlarged thyroid, therefore I have referred her to surgery to see if they feel she is a candidate for thyroidectomy and a new endocrine to obtain opinion and management of her thyroid disease.    - also, please ask her if she has enough tapazole or if she needs refills and clarify the dose/frequency with her and update med list (even if she does not need refills).

## 2018-04-30 ENCOUNTER — Other Ambulatory Visit: Payer: Self-pay

## 2018-04-30 DIAGNOSIS — E0590 Thyrotoxicosis, unspecified without thyrotoxic crisis or storm: Secondary | ICD-10-CM

## 2018-04-30 MED ORDER — METHIMAZOLE 10 MG PO TABS: ORAL_TABLET | ORAL | 0 refills | Status: DC

## 2018-04-30 NOTE — Telephone Encounter (Signed)
Patient notified and verbalized understanding. Refill request sent to Dr. Raoul Pitch.

## 2018-05-15 ENCOUNTER — Encounter: Payer: Self-pay | Admitting: Family Medicine

## 2018-05-15 ENCOUNTER — Telehealth: Payer: Self-pay | Admitting: Allergy & Immunology

## 2018-05-15 DIAGNOSIS — G43709 Chronic migraine without aura, not intractable, without status migrainosus: Secondary | ICD-10-CM

## 2018-05-15 DIAGNOSIS — E782 Mixed hyperlipidemia: Secondary | ICD-10-CM

## 2018-05-15 MED ORDER — ATORVASTATIN CALCIUM 20 MG PO TABS: 20.0000 mg | ORAL_TABLET | Freq: Every day | ORAL | 2 refills | Status: DC

## 2018-05-15 MED ORDER — AMITRIPTYLINE HCL 25 MG PO TABS: ORAL_TABLET | ORAL | 2 refills | Status: DC

## 2018-05-15 MED ORDER — METHIMAZOLE 10 MG PO TABS: ORAL_TABLET | ORAL | 2 refills | Status: DC

## 2018-05-15 MED FILL — ATORVASTATIN CALCIUM 20 MG: 20 | 90 days supply | Qty: 90 | Fill #0

## 2018-05-15 MED FILL — AMITRIPTYLINE HCL 25 MG TAB: 25 | 90 days supply | Qty: 180 | Fill #0

## 2018-05-15 MED FILL — methIMAzole 10 MG TABS: 10 | 85 days supply | Qty: 98 | Fill #0

## 2018-05-15 NOTE — Telephone Encounter (Signed)
Sent in 90 day scripts to [facility].   Salvatore Marvel, MD [facility]

## 2018-05-31 ENCOUNTER — Ambulatory Visit: Payer: 59 | Admitting: Internal Medicine

## 2018-06-14 ENCOUNTER — Ambulatory Visit: Payer: Self-pay | Admitting: General Surgery

## 2018-06-14 DIAGNOSIS — E041 Nontoxic single thyroid nodule: Secondary | ICD-10-CM | POA: Diagnosis not present

## 2018-06-14 NOTE — H&P (Signed)
History of Present Illness Ralene Ok MD; 06/14/2018 9:48 AM) The patient is a 47 year old female who presents with a thyroid nodule. Referred by:Dr. Raoul Pitch Chief Complaint: Thyroid goiter  Patient is a 47 year old female with a approximate 10 year history of thyroid goiter. Patient was on long-term treatment with methimazole. Patient has had her thyroid cyst followed with biopsies over this. Time. Up until approximately 1-2 years ago patient had no symptoms. Patient states that over this last year. Time she said some dysphagia. She states this has progressed. She states that she has not converted to an mainly vegetarian diet to avoid meats. Patient states that she has had no issues with laying down. She does notice that over the last 6 months she's noticed some hoarseness. This is new. She has had an issue with allergies recently and is unsure if her hoarseness is due to this.  Patient did have a biopsy of a right-sided cyst which revealed normal benign follicular cells.  Patient states that she does not do well and is allergic to opiates. Patient takes ibuprofen for pain.   Past Surgical History Lindwood Coke, RN; 06/14/2018 9:31 AM) Gallbladder Surgery - Open   Allergies (Diane Herrin, RN; 06/14/2018 9:36 AM) Morphine Derivatives  Anaphylaxis. NO NARCOTICS = SEIZURES AND ANAPHYLAXIS Allergies Reconciled   Medication History (Diane Herrin, RN; 06/14/2018 9:37 AM) Amitriptyline HCl (25MG  Tablet, Oral) Active. Atorvastatin Calcium (20MG  Tablet, Oral) Active. methIMAzole (10MG  Tablet, Oral) Active. ZyrTEC (1MG /ML Syrup, Oral) Active. Allegra Allergy (60MG  Tablet, Oral) Active. Omeprazole (20MG  Capsule DR, Oral) Active. Medications Reconciled  Social History Lindwood Coke, RN; 06/14/2018 9:31 AM) Alcohol use  Occasional alcohol use. Caffeine use  Carbonated beverages. No drug use  Tobacco use  Former smoker.  Family History Lindwood Coke, RN; 06/14/2018  9:31 AM) Alcohol Abuse  Father. Heart disease in female family member before age 8  Migraine Headache  Mother.  Pregnancy / Birth History Lindwood Coke, RN; 06/14/2018 9:31 AM) Durenda Age  2 Maternal age  97-30  Other Problems Lindwood Coke, RN; 06/14/2018 9:31 AM) Cholelithiasis  Gastroesophageal Reflux Disease  Hypercholesterolemia  Kidney Stone  Thyroid Disease   Note:No Narcotics - anaphylaxis and seizures    Review of Systems Ralene Ok MD; 06/14/2018 9:46 AM) General Present- Night Sweats. Not Present- Appetite Loss, Chills, Fatigue, Fever, Weight Gain and Weight Loss. All other systems negative  Vitals (Diane Herrin RN; 06/14/2018 9:38 AM) 06/14/2018 9:37 AM Weight: 161.38 lb Height: 65in Body Surface Area: 1.81 m Body Mass Index: 26.85 kg/m  Temp.: 98.48F(Oral)  Pulse: 103 (Regular)  P.OX: 93% (Room air) BP: 126/78 (Sitting, Left Arm, Standard)       Physical Exam Ralene Ok MD; 06/14/2018 9:49 AM) The physical exam findings are as follows: Note:Constitutional: No acute distress, conversant, appears stated age  Eyes: Anicteric sclerae, moist conjunctiva, no lid lag  Neck: trachea midline, no cervical lymphadenopathy, thyroid enlargement bilaterally, right lower pole cyst easily palpable.  Lungs: Clear to auscultation biilaterally, normal respiratory effot  Cardiovascular: regular rate & rhythm, no murmurs, no peripheal edema, pedal pulses 2+  GI: Soft, no masses or hepatosplenomegaly, non-tender to palpation  MSK: Normal gait, no clubbing cyanosis, edema  Skin: No rashes, palpation reveals normal skin turgor  Psychiatric: Appropriate judgment and insight, oriented to person, place, and time    Assessment & Plan Ralene Ok MD; 06/14/2018 9:51 AM) THYROID NODULE (E04.1) Impression: Patient is a 47 year old female with history of migraines, and long-term thyroid goiter. Patient had fairly benign biopsies  in the  past. She has some dysphagia recently and secondary to her dysphagia and recent hoarseness she would like to have her thyroid removed. I did discuss with her this could be partial versus total thyroidectomy, and she decided on total thyroidectomy.  1. Secondary to her recent hoarseness we'll have her evaluated by Dr. Benjamine Mola, to rule out any local nerve palsies. 2. We will schedule her for total thyroidectomy. 3. All risks and benefits were discussed with the patient to generally include: infection, bleeding, damage to the recurrent laryngeal nerve, damage to parathyroid glands, and possible need for further surgery. Alternatives were offered and described. All questions were answered and the patient voiced understanding of the procedure and wishes to proceed.

## 2018-07-31 DIAGNOSIS — K219 Gastro-esophageal reflux disease without esophagitis: Secondary | ICD-10-CM | POA: Diagnosis not present

## 2018-07-31 DIAGNOSIS — R490 Dysphonia: Secondary | ICD-10-CM | POA: Diagnosis not present

## 2018-08-03 MED FILL — OMEPRAZOLE 20 MG CPDR: 20 | 90 days supply | Qty: 90 | Fill #0

## 2018-08-03 MED FILL — FAMOTIDINE 20 MG TABLET: 20 | 90 days supply | Qty: 90 | Fill #0

## 2018-08-14 NOTE — Pre-Procedure Instructions (Signed)
Leslie Valencia  08/14/2018      [place], [place] [address] Phone: [phone] Fax: [phone]  [id number] - [address] [place] [address] [place] [address] [place] [address] Phone: [phone] Fax: [phone]    Your procedure is scheduled on 08/22/2018.  Report to [facility] Admitting at [place].M.  Call this number if you have problems the morning of surgery:  [phone]   Remember:  Do not eat after midnight the night before your surgery.  You may drink clear liquids until 0530 .  Clear liquids allowed are: Water, Juice (non-citric and without pulp), Carbonated beverages, Clear Tea, Black Coffee only and Gatorade    Take these medicines the morning of surgery with A SIP OF WATER : Cetirizine (Zyrtec) Omeprazole (Prilosec)  7 days prior to surgery STOP taking any Aspirin (unless otherwise instructed by your surgeon), Aleve, Naproxen, Ibuprofen, Motrin, Advil, Goody's, BC's, all herbal medications, fish oil, and all vitamins     Do not wear jewelry, make-up or nail polish.  Do not wear lotions, powders, or perfumes, or deodorant.  Do not shave 48 hours prior to surgery.   Do not bring valuables to the hospital.  [facility] is not responsible for any belongings or valuables.  Contacts, eyeglasses, hearing aids, dentures or bridgework may not be worn into surgery.  Leave your suitcase in the car.  After surgery it may be brought to your room.  For patients admitted to the hospital, discharge time will be determined by your treatment team.  Patients discharged the day of surgery will not be allowed to drive home.   Name and phone number of your driver:    Special instructions:   [facility]- Preparing For Surgery  Before surgery, you can play an important role. Because skin is not sterile, your skin needs to be as free of germs as  possible. You can reduce the number of germs on your skin by washing with CHG (chlorahexidine gluconate) Soap before surgery.  CHG is an antiseptic cleaner which kills germs and bonds with the skin to continue killing germs even after washing.    Oral Hygiene is also important to reduce your risk of infection.  Remember - BRUSH YOUR TEETH THE MORNING OF SURGERY WITH YOUR REGULAR TOOTHPASTE  Please do not use if you have an allergy to CHG or antibacterial soaps. If your skin becomes reddened/irritated stop using the CHG.  Do not shave (including legs and underarms) for at least 48 hours prior to first CHG shower. It is OK to shave your face.  Please follow these instructions carefully.   1. Shower the NIGHT BEFORE SURGERY and the MORNING OF SURGERY with CHG.   2. If you chose to wash your hair, wash your hair first as usual with your normal shampoo.  3. After you shampoo, rinse your hair and body thoroughly to remove the shampoo.  4. Use CHG as you would any other liquid soap. You can apply CHG directly to the skin and wash gently with a scrungie or a clean washcloth.   5. Apply the CHG Soap to your body ONLY FROM THE NECK DOWN.  Do not use on open wounds or open sores. Avoid contact with your eyes, ears, mouth and genitals (private parts). Wash Face and genitals (private parts)  with your normal soap.  6. Wash thoroughly, paying special attention  to the area where your surgery will be performed.  7. Thoroughly rinse your body with warm water from the neck down.  8. DO NOT shower/wash with your normal soap after using and rinsing off the CHG Soap.  9. Pat yourself dry with a CLEAN TOWEL.  10. Wear CLEAN PAJAMAS to bed the night before surgery, wear comfortable clothes the morning of surgery  11. Place CLEAN SHEETS on your bed the night of your first shower and DO NOT SLEEP WITH PETS.    Day of Surgery: Shower as stated above. Do not apply any deodorants/lotions.  Please wear clean  clothes to the hospital/surgery center.   Remember to brush your teeth WITH YOUR REGULAR TOOTHPASTE.    Please read over the following fact sheets that you were given.

## 2018-08-15 ENCOUNTER — Other Ambulatory Visit: Payer: Self-pay

## 2018-08-15 ENCOUNTER — Encounter (HOSPITAL_COMMUNITY)
Admission: RE | Admit: 2018-08-15 | Discharge: 2018-08-15 | Disposition: A | Discharge status: Home / Self Care | Payer: 59 | Source: Ambulatory Visit | Attending: General Surgery | Admitting: General Surgery

## 2018-08-15 ENCOUNTER — Ambulatory Visit (HOSPITAL_COMMUNITY)
Admission: RE | Admit: 2018-08-15 | Discharge: 2018-08-15 | Disposition: A | Discharge status: Home / Self Care | Payer: 59 | Source: Ambulatory Visit | Attending: Anesthesiology | Admitting: Anesthesiology

## 2018-08-15 ENCOUNTER — Encounter (HOSPITAL_COMMUNITY): Payer: Self-pay

## 2018-08-15 DIAGNOSIS — Z01811 Encounter for preprocedural respiratory examination: Secondary | ICD-10-CM | POA: Insufficient documentation

## 2018-08-15 DIAGNOSIS — Z01818 Encounter for other preprocedural examination: Secondary | ICD-10-CM | POA: Diagnosis not present

## 2018-08-15 DIAGNOSIS — Z87891 Personal history of nicotine dependence: Secondary | ICD-10-CM | POA: Diagnosis not present

## 2018-08-15 HISTORY — DX: Esophagitis, unspecified without bleeding: K20.90

## 2018-08-15 HISTORY — DX: Esophagitis, unspecified: K20.9

## 2018-08-15 HISTORY — DX: Personal history of urinary calculi: Z87.442

## 2018-08-15 LAB — CBC
HCT: 47.2 % — ABNORMAL HIGH (ref 36.0–46.0)
Hemoglobin: 15.5 g/dL — ABNORMAL HIGH (ref 12.0–15.0)
MCH: 31.1 pg (ref 26.0–34.0)
MCHC: 32.8 g/dL (ref 30.0–36.0)
MCV: 94.6 fL (ref 80.0–100.0)
Platelets: 219 10*3/uL (ref 150–400)
RBC: 4.99 MIL/uL (ref 3.87–5.11)
RDW: 11.8 % (ref 11.5–15.5)
WBC: 6.9 10*3/uL (ref 4.0–10.5)
nRBC: 0 % (ref 0.0–0.2)

## 2018-08-15 LAB — BASIC METABOLIC PANEL
Anion gap: 14 (ref 5–15)
BUN: 8 mg/dL (ref 6–20)
CO2: 26 mmol/L (ref 22–32)
Calcium: 10.1 mg/dL (ref 8.9–10.3)
Chloride: 99 mmol/L (ref 98–111)
Creatinine, Ser: 1.02 mg/dL — ABNORMAL HIGH (ref 0.44–1.00)
GFR calc Af Amer: >60 mL/min (ref >60)
Glucose, Bld: 95 mg/dL (ref 70–99)
Potassium: 3.7 mmol/L (ref 3.5–5.1)
Sodium: 139 mmol/L (ref 135–145)

## 2018-08-15 NOTE — Progress Notes (Signed)
PCP - Dr. Raoul Pitch Cardiologist - patient denies  Chest x-ray - 08/15/2018 EKG - n/a Stress Test - patient denies ECHO - patient denies Cardiac Cath - patient denies  Sleep Study - patient denies  Anesthesia review: n/a  Patient denies shortness of breath, fever, cough and chest pain at PAT appointment   Patient verbalized understanding of instructions that were given to them at the PAT appointment. Patient was also instructed that they will need to review over the PAT instructions again at home before surgery.

## 2018-08-21 NOTE — Anesthesia Preprocedure Evaluation (Addendum)
Anesthesia Evaluation  Patient identified by MRN, date of birth, ID band Patient awake    Reviewed: Allergy & Precautions, H&P , NPO status , Patient's Chart, lab work & pertinent test results  Airway Mallampati: II  TM Distance: >3 FB Neck ROM: Full    Dental no notable dental hx. (+) Teeth Intact, Dental Advisory Given   Pulmonary asthma , former smoker,    Pulmonary exam normal breath sounds clear to auscultation       Cardiovascular Exercise Tolerance: Good negative cardio ROS   Rhythm:Regular Rate:Normal     Neuro/Psych  Headaches, negative psych ROS   GI/Hepatic Neg liver ROS, GERD  Medicated and Controlled,  Endo/Other  Hyperthyroidism   Renal/GU negative Renal ROS  negative genitourinary   Musculoskeletal   Abdominal   Peds  Hematology negative hematology ROS (+)   Anesthesia Other Findings   Reproductive/Obstetrics negative OB ROS                            Anesthesia Physical Anesthesia Plan  ASA: II  Anesthesia Plan: General   Post-op Pain Management:    Induction: Intravenous  PONV Risk Score and Plan: 4 or greater and Ondansetron, Dexamethasone, Midazolam and Diphenhydramine  Airway Management Planned: Oral ETT  Additional Equipment:   Intra-op Plan:   Post-operative Plan: Extubation in OR  Informed Consent: I have reviewed the patients History and Physical, chart, labs and discussed the procedure including the risks, benefits and alternatives for the proposed anesthesia with the patient or authorized representative who has indicated his/her understanding and acceptance.   Dental advisory given  Plan Discussed with: CRNA  Anesthesia Plan Comments:        Anesthesia Quick Evaluation

## 2018-08-22 ENCOUNTER — Encounter (HOSPITAL_COMMUNITY): Payer: Self-pay | Admitting: General Practice

## 2018-08-22 ENCOUNTER — Observation Stay (HOSPITAL_COMMUNITY)
Admission: RE | Admit: 2018-08-22 | Discharge: 2018-08-23 | Disposition: A | Discharge status: Home / Self Care | Payer: 59 | Source: Ambulatory Visit | Attending: General Surgery | Admitting: General Surgery

## 2018-08-22 ENCOUNTER — Ambulatory Visit (HOSPITAL_COMMUNITY): Payer: 59 | Admitting: Anesthesiology

## 2018-08-22 ENCOUNTER — Other Ambulatory Visit: Payer: Self-pay

## 2018-08-22 ENCOUNTER — Encounter (HOSPITAL_COMMUNITY)
Admission: RE | Disposition: A | Discharge status: Home / Self Care | Payer: Self-pay | Source: Ambulatory Visit | Attending: General Surgery

## 2018-08-22 DIAGNOSIS — R1310 Dysphagia, unspecified: Secondary | ICD-10-CM | POA: Diagnosis not present

## 2018-08-22 DIAGNOSIS — E890 Postprocedural hypothyroidism: Secondary | ICD-10-CM

## 2018-08-22 DIAGNOSIS — Z79899 Other long term (current) drug therapy: Secondary | ICD-10-CM | POA: Insufficient documentation

## 2018-08-22 DIAGNOSIS — K219 Gastro-esophageal reflux disease without esophagitis: Secondary | ICD-10-CM | POA: Insufficient documentation

## 2018-08-22 DIAGNOSIS — Z885 Allergy status to narcotic agent status: Secondary | ICD-10-CM | POA: Insufficient documentation

## 2018-08-22 DIAGNOSIS — G43909 Migraine, unspecified, not intractable, without status migrainosus: Secondary | ICD-10-CM | POA: Insufficient documentation

## 2018-08-22 DIAGNOSIS — Z9089 Acquired absence of other organs: Secondary | ICD-10-CM

## 2018-08-22 DIAGNOSIS — Z87891 Personal history of nicotine dependence: Secondary | ICD-10-CM | POA: Diagnosis not present

## 2018-08-22 DIAGNOSIS — E049 Nontoxic goiter, unspecified: Secondary | ICD-10-CM | POA: Diagnosis not present

## 2018-08-22 DIAGNOSIS — Z888 Allergy status to other drugs, medicaments and biological substances status: Secondary | ICD-10-CM | POA: Diagnosis not present

## 2018-08-22 DIAGNOSIS — E782 Mixed hyperlipidemia: Secondary | ICD-10-CM | POA: Diagnosis not present

## 2018-08-22 DIAGNOSIS — E041 Nontoxic single thyroid nodule: Secondary | ICD-10-CM | POA: Diagnosis not present

## 2018-08-22 DIAGNOSIS — E7800 Pure hypercholesterolemia, unspecified: Secondary | ICD-10-CM | POA: Insufficient documentation

## 2018-08-22 DIAGNOSIS — E0590 Thyrotoxicosis, unspecified without thyrotoxic crisis or storm: Secondary | ICD-10-CM | POA: Diagnosis not present

## 2018-08-22 HISTORY — DX: Acquired absence of other organs: Z90.89

## 2018-08-22 HISTORY — PX: THYROIDECTOMY: SHX17

## 2018-08-22 HISTORY — DX: Postprocedural hypothyroidism: E89.0

## 2018-08-22 SURGERY — THYROIDECTOMY
Anesthesia: General

## 2018-08-22 MED ORDER — DIPHENHYDRAMINE HCL 50 MG/ML IJ SOLN
INTRAMUSCULAR | Status: AC
  Filled 2018-08-22: qty 1

## 2018-08-22 MED ORDER — FAMOTIDINE 20 MG PO TABS
20.0000 mg | ORAL_TABLET | Freq: Every day | ORAL | Status: DC
  Administered 2018-08-22: 20 mg via ORAL
  Filled 2018-08-22: qty 1

## 2018-08-22 MED ORDER — KETOROLAC TROMETHAMINE 30 MG/ML IJ SOLN: 30.0000 mg | Freq: Four times a day (QID) | INTRAMUSCULAR | Status: DC | PRN

## 2018-08-22 MED ORDER — FENTANYL CITRATE (PF) 100 MCG/2ML IJ SOLN: 25.0000 ug | INTRAMUSCULAR | Status: DC | PRN

## 2018-08-22 MED ORDER — LIDOCAINE 2% (20 MG/ML) 5 ML SYRINGE
INTRAMUSCULAR | Status: AC
  Filled 2018-08-22: qty 5

## 2018-08-22 MED ORDER — DEXAMETHASONE SODIUM PHOSPHATE 4 MG/ML IJ SOLN
INTRAMUSCULAR | Status: DC | PRN
  Administered 2018-08-22: 10 mg via INTRAVENOUS

## 2018-08-22 MED ORDER — CHLORHEXIDINE GLUCONATE CLOTH 2 % EX PADS: 6.0000 | MEDICATED_PAD | Freq: Once | CUTANEOUS | Status: DC

## 2018-08-22 MED ORDER — SODIUM CHLORIDE 0.9 % IV SOLN: INTRAVENOUS | Status: DC | PRN

## 2018-08-22 MED ORDER — ONDANSETRON HCL 4 MG/2ML IJ SOLN: 4.0000 mg | Freq: Four times a day (QID) | INTRAMUSCULAR | Status: DC | PRN

## 2018-08-22 MED ORDER — B COMPLEX-C PO TABS
1.0000 | ORAL_TABLET | Freq: Every day | ORAL | Status: DC
  Filled 2018-08-22 (×2): qty 1

## 2018-08-22 MED ORDER — ROCURONIUM BROMIDE 50 MG/5ML IV SOSY
PREFILLED_SYRINGE | INTRAVENOUS | Status: AC
  Filled 2018-08-22: qty 5

## 2018-08-22 MED ORDER — GABAPENTIN 300 MG PO CAPS
300.0000 mg | ORAL_CAPSULE | ORAL | Status: AC
  Administered 2018-08-22: 300 mg via ORAL

## 2018-08-22 MED ORDER — CEFAZOLIN SODIUM-DEXTROSE 2-4 GM/100ML-% IV SOLN
INTRAVENOUS | Status: AC
  Filled 2018-08-22: qty 100

## 2018-08-22 MED ORDER — LACTATED RINGERS IV SOLN
INTRAVENOUS | Status: DC | PRN
  Administered 2018-08-22: 08:00:00 via INTRAVENOUS

## 2018-08-22 MED ORDER — CELECOXIB 200 MG PO CAPS
ORAL_CAPSULE | ORAL | Status: AC
  Administered 2018-08-22: 200 mg via ORAL
  Filled 2018-08-22: qty 1

## 2018-08-22 MED ORDER — BUPIVACAINE HCL (PF) 0.25 % IJ SOLN
INTRAMUSCULAR | Status: AC
  Filled 2018-08-22: qty 30

## 2018-08-22 MED ORDER — 0.9 % SODIUM CHLORIDE (POUR BTL) OPTIME
TOPICAL | Status: DC | PRN
  Administered 2018-08-22: 1000 mL

## 2018-08-22 MED ORDER — DIPHENHYDRAMINE HCL 50 MG/ML IJ SOLN: 12.5000 mg | Freq: Four times a day (QID) | INTRAMUSCULAR | Status: DC | PRN

## 2018-08-22 MED ORDER — PROPOFOL 10 MG/ML IV BOLUS
INTRAVENOUS | Status: DC | PRN
  Administered 2018-08-22: 160 mg via INTRAVENOUS
  Administered 2018-08-22: 40 mg via INTRAVENOUS

## 2018-08-22 MED ORDER — ACETAMINOPHEN 500 MG PO TABS
1000.0000 mg | ORAL_TABLET | ORAL | Status: AC
  Administered 2018-08-22: 1000 mg via ORAL

## 2018-08-22 MED ORDER — HEMOSTATIC AGENTS (NO CHARGE) OPTIME
TOPICAL | Status: DC | PRN
  Administered 2018-08-22: 1 via TOPICAL

## 2018-08-22 MED ORDER — DEXTROSE-NACL 5-0.9 % IV SOLN
INTRAVENOUS | Status: DC
  Administered 2018-08-22: 16:00:00 via INTRAVENOUS

## 2018-08-22 MED ORDER — FENTANYL CITRATE (PF) 250 MCG/5ML IJ SOLN
INTRAMUSCULAR | Status: DC | PRN
  Administered 2018-08-22 (×2): 50 ug via INTRAVENOUS
  Administered 2018-08-22: 100 ug via INTRAVENOUS

## 2018-08-22 MED ORDER — CEFAZOLIN SODIUM-DEXTROSE 2-4 GM/100ML-% IV SOLN
2.0000 g | INTRAVENOUS | Status: AC
  Administered 2018-08-22: 2 g via INTRAVENOUS

## 2018-08-22 MED ORDER — ONDANSETRON 4 MG PO TBDP: 4.0000 mg | ORAL_TABLET | Freq: Four times a day (QID) | ORAL | Status: DC | PRN

## 2018-08-22 MED ORDER — CELECOXIB 200 MG PO CAPS
200.0000 mg | ORAL_CAPSULE | ORAL | Status: AC
  Administered 2018-08-22: 200 mg via ORAL

## 2018-08-22 MED ORDER — MIDAZOLAM HCL 2 MG/2ML IJ SOLN
INTRAMUSCULAR | Status: AC
  Filled 2018-08-22: qty 2

## 2018-08-22 MED ORDER — B COMPLEX PO TABS: 1.0000 | ORAL_TABLET | Freq: Every day | ORAL | Status: DC

## 2018-08-22 MED ORDER — HYDRALAZINE HCL 20 MG/ML IJ SOLN: 10.0000 mg | INTRAMUSCULAR | Status: DC | PRN

## 2018-08-22 MED ORDER — DEXAMETHASONE SODIUM PHOSPHATE 10 MG/ML IJ SOLN
INTRAMUSCULAR | Status: AC
  Filled 2018-08-22: qty 1

## 2018-08-22 MED ORDER — FENTANYL CITRATE (PF) 250 MCG/5ML IJ SOLN
INTRAMUSCULAR | Status: AC
  Filled 2018-08-22: qty 5

## 2018-08-22 MED ORDER — ONDANSETRON HCL 4 MG/2ML IJ SOLN
INTRAMUSCULAR | Status: AC
  Filled 2018-08-22: qty 2

## 2018-08-22 MED ORDER — ONDANSETRON HCL 4 MG/2ML IJ SOLN
INTRAMUSCULAR | Status: DC | PRN
  Administered 2018-08-22: 4 mg via INTRAVENOUS

## 2018-08-22 MED ORDER — KETOROLAC TROMETHAMINE 30 MG/ML IJ SOLN
30.0000 mg | Freq: Four times a day (QID) | INTRAMUSCULAR | Status: DC
  Administered 2018-08-22 – 2018-08-23 (×3): 30 mg via INTRAVENOUS
  Filled 2018-08-22 (×3): qty 1

## 2018-08-22 MED ORDER — PROPOFOL 10 MG/ML IV BOLUS
INTRAVENOUS | Status: AC
  Filled 2018-08-22: qty 40

## 2018-08-22 MED ORDER — KETOROLAC TROMETHAMINE 30 MG/ML IJ SOLN
INTRAMUSCULAR | Status: AC
  Filled 2018-08-22: qty 1

## 2018-08-22 MED ORDER — LIDOCAINE HCL (CARDIAC) PF 100 MG/5ML IV SOSY
PREFILLED_SYRINGE | INTRAVENOUS | Status: DC | PRN
  Administered 2018-08-22: 100 mg via INTRAVENOUS

## 2018-08-22 MED ORDER — ACETAMINOPHEN 500 MG PO TABS
ORAL_TABLET | ORAL | Status: AC
  Administered 2018-08-22: 1000 mg via ORAL
  Filled 2018-08-22: qty 2

## 2018-08-22 MED ORDER — ATORVASTATIN CALCIUM 10 MG PO TABS
20.0000 mg | ORAL_TABLET | Freq: Every day | ORAL | Status: DC
  Administered 2018-08-22: 20 mg via ORAL
  Filled 2018-08-22: qty 2

## 2018-08-22 MED ORDER — ROCURONIUM BROMIDE 100 MG/10ML IV SOLN
INTRAVENOUS | Status: DC | PRN
  Administered 2018-08-22: 20 mg via INTRAVENOUS
  Administered 2018-08-22: 50 mg via INTRAVENOUS

## 2018-08-22 MED ORDER — PANTOPRAZOLE SODIUM 40 MG PO TBEC: 40.0000 mg | DELAYED_RELEASE_TABLET | Freq: Every day | ORAL | Status: DC

## 2018-08-22 MED ORDER — DIPHENHYDRAMINE HCL 12.5 MG/5ML PO ELIX: 12.5000 mg | ORAL_SOLUTION | Freq: Four times a day (QID) | ORAL | Status: DC | PRN

## 2018-08-22 MED ORDER — AMITRIPTYLINE HCL 25 MG PO TABS
25.0000 mg | ORAL_TABLET | Freq: Every day | ORAL | Status: DC
  Administered 2018-08-22: 25 mg via ORAL
  Filled 2018-08-22: qty 1

## 2018-08-22 MED ORDER — MIDAZOLAM HCL 5 MG/5ML IJ SOLN
INTRAMUSCULAR | Status: DC | PRN
  Administered 2018-08-22: 2 mg via INTRAVENOUS

## 2018-08-22 MED ORDER — MAGNESIUM OXIDE 400 (241.3 MG) MG PO TABS
400.0000 mg | ORAL_TABLET | Freq: Two times a day (BID) | ORAL | Status: DC
  Administered 2018-08-22: 400 mg via ORAL
  Filled 2018-08-22: qty 1

## 2018-08-22 MED ORDER — BUPIVACAINE HCL 0.25 % IJ SOLN
INTRAMUSCULAR | Status: DC | PRN
  Administered 2018-08-22: 9 mL

## 2018-08-22 MED ORDER — GABAPENTIN 300 MG PO CAPS
ORAL_CAPSULE | ORAL | Status: AC
  Administered 2018-08-22: 300 mg via ORAL
  Filled 2018-08-22: qty 1

## 2018-08-22 MED ORDER — CALCIUM CARBONATE-VITAMIN D 500-200 MG-UNIT PO TABS
2.0000 | ORAL_TABLET | Freq: Three times a day (TID) | ORAL | Status: DC
  Administered 2018-08-22 (×2): 2 via ORAL
  Filled 2018-08-22 (×2): qty 2

## 2018-08-22 MED ORDER — SUGAMMADEX SODIUM 200 MG/2ML IV SOLN
INTRAVENOUS | Status: DC | PRN
  Administered 2018-08-22: 153.4 mg via INTRAVENOUS

## 2018-08-22 MED ORDER — DIPHENHYDRAMINE HCL 50 MG/ML IJ SOLN
INTRAMUSCULAR | Status: DC | PRN
  Administered 2018-08-22: 12.5 mg via INTRAVENOUS

## 2018-08-22 SURGICAL SUPPLY — 50 items
ATTRACTOMAT 16X20 MAGNETIC DRP (DRAPES) ×3 IMPLANT
BLADE CLIPPER SURG (BLADE) IMPLANT
BLADE SURG 15 STRL LF DISP TIS (BLADE) ×1 IMPLANT
BLADE SURG 15 STRL SS (BLADE) ×2
CANISTER SUCT 3000ML PPV (MISCELLANEOUS) ×3 IMPLANT
CHLORAPREP W/TINT 26ML (MISCELLANEOUS) ×3 IMPLANT
CLIP VESOCCLUDE MED 24/CT (CLIP) ×6 IMPLANT
CLIP VESOCCLUDE SM WIDE 24/CT (CLIP) ×6 IMPLANT
CONT SPEC 4OZ CLIKSEAL STRL BL (MISCELLANEOUS) IMPLANT
COVER SURGICAL LIGHT HANDLE (MISCELLANEOUS) ×3 IMPLANT
COVER WAND RF STERILE (DRAPES) ×3 IMPLANT
CRADLE DONUT ADULT HEAD (MISCELLANEOUS) ×3 IMPLANT
DERMABOND ADVANCED (GAUZE/BANDAGES/DRESSINGS) ×2
DERMABOND ADVANCED .7 DNX12 (GAUZE/BANDAGES/DRESSINGS) ×1 IMPLANT
DRAPE LAPAROTOMY 100X72 PEDS (DRAPES) ×3 IMPLANT
DRAPE UTILITY XL STRL (DRAPES) ×3 IMPLANT
ELECT COATED BLADE 2.86 ST (ELECTRODE) ×3 IMPLANT
ELECT REM PT RETURN 9FT ADLT (ELECTROSURGICAL) ×3
ELECTRODE REM PT RTRN 9FT ADLT (ELECTROSURGICAL) ×1 IMPLANT
GAUZE 4X4 16PLY RFD (DISPOSABLE) ×3 IMPLANT
GAUZE SPONGE 4X4 12PLY STRL (GAUZE/BANDAGES/DRESSINGS) ×3 IMPLANT
GLOVE BIO SURGEON STRL SZ7.5 (GLOVE) ×12 IMPLANT
GOWN STRL REUS W/ TWL LRG LVL3 (GOWN DISPOSABLE) ×1 IMPLANT
GOWN STRL REUS W/ TWL XL LVL3 (GOWN DISPOSABLE) ×1 IMPLANT
GOWN STRL REUS W/TWL LRG LVL3 (GOWN DISPOSABLE) ×2
GOWN STRL REUS W/TWL XL LVL3 (GOWN DISPOSABLE) ×2
HEMOSTAT SURGICEL 2X4 FIBR (HEMOSTASIS) ×3 IMPLANT
ILLUMINATOR WAVEGUIDE N/F (MISCELLANEOUS) ×3 IMPLANT
KIT BASIN OR (CUSTOM PROCEDURE TRAY) ×3 IMPLANT
KIT TURNOVER KIT B (KITS) ×3 IMPLANT
NEEDLE HYPO 25GX1X1/2 BEV (NEEDLE) ×3 IMPLANT
NS IRRIG 1000ML POUR BTL (IV SOLUTION) ×3 IMPLANT
PACK SURGICAL SETUP 50X90 (CUSTOM PROCEDURE TRAY) ×3 IMPLANT
PAD ARMBOARD 7.5X6 YLW CONV (MISCELLANEOUS) ×3 IMPLANT
PENCIL BUTTON HOLSTER BLD 10FT (ELECTRODE) ×3 IMPLANT
SHEARS HARMONIC 9CM CVD (BLADE) ×3 IMPLANT
SPECIMEN JAR MEDIUM (MISCELLANEOUS) ×3 IMPLANT
SPONGE INTESTINAL PEANUT (DISPOSABLE) ×3 IMPLANT
SUT MNCRL AB 4-0 PS2 18 (SUTURE) ×3 IMPLANT
SUT SILK 2 0 (SUTURE) ×2
SUT SILK 2-0 18XBRD TIE 12 (SUTURE) ×1 IMPLANT
SUT SILK 3 0 (SUTURE) ×2
SUT SILK 3-0 18XBRD TIE 12 (SUTURE) ×1 IMPLANT
SUT VIC AB 3-0 SH 18 (SUTURE) ×9 IMPLANT
SYR BULB 3OZ (MISCELLANEOUS) ×3 IMPLANT
SYR CONTROL 10ML LL (SYRINGE) ×3 IMPLANT
TOWEL OR 17X24 6PK STRL BLUE (TOWEL DISPOSABLE) ×3 IMPLANT
TOWEL OR 17X26 10 PK STRL BLUE (TOWEL DISPOSABLE) ×6 IMPLANT
TUBE CONNECTING 12'X1/4 (SUCTIONS) ×1
TUBE CONNECTING 12X1/4 (SUCTIONS) ×2 IMPLANT

## 2018-08-22 NOTE — Discharge Instructions (Signed)
Thyroidectomy, Care After Refer to this sheet in the next few weeks. These instructions provide you with information about caring for yourself after your procedure. Your health care provider may also give you more specific instructions. Your treatment has been planned according to current medical practices, but problems sometimes occur. Call your health care provider if you have any problems or questions after your procedure. What can I expect after the procedure? After your procedure, it is typical to have:  Mild pain in the neck or upper body, especially when swallowing.  A sore throat.  A weak voice.  Follow these instructions at home:  Take medicines only as directed by your health care provider.  If your entire thyroid gland was removed, you may need to take thyroid hormone medicine from now on.  Do not take medicines that contain aspirin and ibuprofen until your health care provider says that you can. These medicines can increase your risk of bleeding.  Some pain medicines cause constipation. Drink enough fluid to keep your urine clear or pale yellow. This can help to prevent constipation.  Start slowly with eating. You may need to have only liquids and soft foods for a few days or as directed by your health care provider.  Do not take baths, swim, or use a hot tub until your health care provider approves.  There are many different ways to close and cover an incision, including stitches (sutures), skin glue, and adhesive strips. Follow your health care provider's instructions for: ? Incision care. ? Bandage (dressing) changes and removal. ? Incision closure removal.  Resume your usual activities as directed by your health care provider.  For the first 10 days after the procedure or as instructed by your health care provider: ? Do not lift anything heavier than 20 lb (9.1 kg). ? Do not jog, swim, or do other strenuous exercises. ? Do not play contact sports.  Keep all  follow-up visits as directed by your health care provider. This is important. Contact a health care provider if:  The soreness in your throat gets worse.  You have increased pain at your incision or incisions.  You have increased bleeding from an incision.  Your incision becomes infected. Watch for: ? Swelling. ? Redness. ? Warmth. ? Pus.  You notice a bad smell coming from an incision or dressing.  You have a fever.  You feel lightheaded or faint.  You have numbness, tingling, or muscle spasms in your: ? Arms. ? Hands. ? Feet. ? Face.  You have trouble swallowing. Get help right away if:  You develop a rash.  You have difficulty breathing.  You hear whistling noises coming from your chest.  You develop a cough that gets worse.  Your speech changes, or you have hoarseness that gets worse. This information is not intended to replace advice given to you by your health care provider. Make sure you discuss any questions you have with your health care provider. Document Released: 03/18/2005 Document Revised: 05/01/2016 Document Reviewed: 01/29/2014 Elsevier Interactive Patient Education  2018 [employer].

## 2018-08-22 NOTE — Anesthesia Postprocedure Evaluation (Signed)
Anesthesia Post Note  Patient: Leslie Valencia  Procedure(s) Performed: TOTAL THYROIDECTOMY (N/A )     Patient location during evaluation: PACU Anesthesia Type: General Level of consciousness: awake and alert Pain management: pain level controlled Vital Signs Assessment: post-procedure vital signs reviewed and stable Respiratory status: spontaneous breathing, nonlabored ventilation and respiratory function stable Cardiovascular status: blood pressure returned to baseline and stable Postop Assessment: no apparent nausea or vomiting Anesthetic complications: no    Last Vitals:  Vitals:   08/22/18 1045 08/22/18 1100  BP: (!) 152/82 (!) 150/90  Pulse: 85 87  Resp: (!) 21 18  Temp:    SpO2: 100% 99%    Last Pain:  Vitals:   08/22/18 1100  TempSrc:   PainSc: 0-No pain                 Kennedi Lizardo,W. EDMOND

## 2018-08-22 NOTE — H&P (Signed)
History of Present Illness  The patient is a 47 year old female who presents with a thyroid nodule. Referred by:Dr. Raoul Pitch Chief Complaint: Thyroid goiter  Patient is a 47 year old female with a approximate 10 year history of thyroid goiter. Patient was on long-term treatment with methimazole. Patient has had her thyroid cyst followed with biopsies over this. Time. Up until approximately 1-2 years ago patient had no symptoms. Patient states that over this last year. Time she said some dysphagia. She states this has progressed. She states that she has not converted to an mainly vegetarian diet to avoid meats. Patient states that she has had no issues with laying down. She does notice that over the last 6 months she's noticed some hoarseness. This is new. She has had an issue with allergies recently and is unsure if her hoarseness is due to this.  Patient did have a biopsy of a right-sided cyst which revealed normal benign follicular cells.  Patient states that she does not do well and is allergic to opiates. Patient takes ibuprofen for pain.   Past Surgical History Gallbladder Surgery - Open   Allergies  Morphine Derivatives  Anaphylaxis. NO NARCOTICS = SEIZURES AND ANAPHYLAXIS Allergies Reconciled   Medication History  Amitriptyline HCl (25MG  Tablet, Oral) Active. Atorvastatin Calcium (20MG  Tablet, Oral) Active. methIMAzole (10MG  Tablet, Oral) Active. ZyrTEC (1MG /ML Syrup, Oral) Active. Allegra Allergy (60MG  Tablet, Oral) Active. Omeprazole (20MG  Capsule DR, Oral) Active. Medications Reconciled  Social History  Alcohol use  Occasional alcohol use. Caffeine use  Carbonated beverages. No drug use  Tobacco use  Former smoker.  Family History  Alcohol Abuse  Father. Heart disease in female family member before age 60  Migraine Headache  Mother.  Pregnancy / Birth History  Gravida  2 Maternal age  29-30  Other Problems   Cholelithiasis  Gastroesophageal Reflux Disease  Hypercholesterolemia  Kidney Stone  Thyroid Disease   Note:No Narcotics - anaphylaxis and seizures    Review of Systems  General Present- Night Sweats. Not Present- Appetite Loss, Chills, Fatigue, Fever, Weight Gain and Weight Loss. All other systems negative  BP (!) 150/89   Pulse 84   Temp 98.3 F (36.8 C) (Oral)   Resp 18   Ht 5\' 5"  (1.651 m)   Wt 76.7 kg   LMP 02/11/2015 (Approximate)   SpO2 97%   BMI 28.14 kg/m     Physical Exam  The physical exam findings are as follows: Note:Constitutional: No acute distress, conversant, appears stated age  Eyes: Anicteric sclerae, moist conjunctiva, no lid lag  Neck: trachea midline, no cervical lymphadenopathy, thyroid enlargement bilaterally, right lower pole cyst easily palpable.  Lungs: Clear to auscultation biilaterally, normal respiratory effot  Cardiovascular: regular rate & rhythm, no murmurs, no peripheal edema, pedal pulses 2+  GI: Soft, no masses or hepatosplenomegaly, non-tender to palpation  MSK: Normal gait, no clubbing cyanosis, edema  Skin: No rashes, palpation reveals normal skin turgor  Psychiatric: Appropriate judgment and insight, oriented to person, place, and time    Assessment & Plan THYROID NODULE (E04.1) Impression: Patient is a 47 year old female with history of migraines, and long-term thyroid goiter. Patient had fairly benign biopsies in the past. She has some dysphagia recently and secondary to her dysphagia and recent hoarseness she would like to have her thyroid removed. I did discuss with her this could be partial versus total thyroidectomy, and she decided on total thyroidectomy.  1. Secondary to her recent hoarseness we'll have her evaluated by Dr. Benjamine Mola, to  rule out any local nerve palsies. 2. We will schedule her for total thyroidectomy. 3. All risks and benefits were discussed with the patient to generally  include: infection, bleeding, damage to the recurrent laryngeal nerve, damage to parathyroid glands, and possible need for further surgery. Alternatives were offered and described. All questions were answered and the patient voiced understanding of the procedure and wishes to proceed.

## 2018-08-22 NOTE — Op Note (Signed)
08/22/2018  10:16 AM  PATIENT:  Leslie Valencia  47 y.o. female  PRE-OPERATIVE DIAGNOSIS:  Thyroid goiter  POST-OPERATIVE DIAGNOSIS:  Thyroid goiter  PROCEDURE:  Procedure(s): TOTAL THYROIDECTOMY (N/A)  SURGEON:  Surgeon(s) and Role:    * Ralene Ok, MD - Primary    * Fanny Skates, MD - Assisting  ANESTHESIA:   Local & GETA  EBL:  25 mL   BLOOD ADMINISTERED:none  DRAINS: none   LOCAL MEDICATIONS USED:  BUPIVICAINE   SPECIMEN:  Source of Specimen:  Total thyroid with stitch marking the Right superior lobe  DISPOSITION OF SPECIMEN:  PATHOLOGY  COUNTS:  YES  TOURNIQUET:  * No tourniquets in log *  DICTATION: .Dragon Dictation  Counts: reported as correct x 2   Findings:mutinodular goiter, Left superior parathyroid gland seen and preserved.  Indications for procedure: The patient is a 86F who had a multnodular thyroid goiter and hoarseness.  Details of the procedure:  The patient was taken back to the operating room. The patient was placed in supine position with bilateral SCDs in place.  The patient was prepped and draped in the usual sterile fashion. After appropriate anitbiotics were confirmed, a time-out was confirmed and all facts were verified.   A 4 cm incision was made approximately 2 fingerbreadths above the sternal notch. Bovie cautery was used to maintain hemostasis dissection was carried down through the platysma. The platysma was elevated and flaps were created superiorly and inferiorly to the thyroid cartilage as well as the sternal notch, repsectively. The strap muscles were identified in the midline and separated. left-sided strap muscles were elevated off the anterior surface of the thyroid. This dissection was carried laterally. The middle thyroid vein was identified and doubly ligated. We proceeded to dissect away the superior lobe and Kitners were used to gently dissect the surrounding musculature from the thyroid. The superior thyroid vessels  were identified and doubly ligated with clips and transected with a Harmonic scalpel. At this time this freed up the superior lobe was able to deliver this into the wound. We also identified the superior parathyroid gland which we preserved.   We continued to dissect the thyroid off of the trachea from lateral to medial direction. The left recurrent laryngeal nerve was identified and dissection well away from the nerve.  The inferior thyroid vessels were identified ligated with clips. At this time Berry's ligament was dissected away from the trachea.   We turned our attention to the right side of the thyroid.   The strap muscles were identified in the midline and separated. Right-sided strap muscles were elevated off the anterior surface of the thyroid. This dissection was carried laterally. The middle thyroid vein was identified and doubly ligated. We proceeded to dissect away the superior lobe and Kitners were used to gently dissect the surrounding musculature from the thyroid. The superior thyroid vessels were identified and doubly ligated with clips and transected with a Harmonic scalpel. At this time this freed up the superior lobe was able to deliver this into the wound.   We continued to dissect the thyroid off of the trachea from lateral to medial direction. The right recurrent laryngeal nerve was identified and dissection was kept well away from the nerve.  The inferior thyroid vessels were identified ligated with clips. At this time Berry's ligament was dissected away from the trachea.   The area was irrigated out. The dissection bed was hemostatic. We placed fibrillar hemostatic agent into the wound. Strap muscles were then  reapproximated in the midline with interrupted 3-0 Vicryl stitches. The platysma was reapproximated using 3-0 Vicryl stitches in interrupted fashion. Skin was then reapproximated using a running subcuticular 4-0 Monocryl. The skin was then dressed with Dermabond. The patient  was taken to the recovery room in stable condition.    PLAN OF CARE: Admit for overnight observation  PATIENT DISPOSITION:  PACU - hemodynamically stable.   Delay start of Pharmacological VTE agent (>24hrs) due to surgical blood loss or risk of bleeding: yes

## 2018-08-22 NOTE — Transfer of Care (Signed)
Immediate Anesthesia Transfer of Care Note  Patient: Leslie Valencia  Procedure(s) Performed: TOTAL THYROIDECTOMY (N/A )  Patient Location: PACU  Anesthesia Type:General  Level of Consciousness: drowsy, patient cooperative and responds to stimulation  Airway & Oxygen Therapy: Patient Spontanous Breathing and Patient connected to face mask oxygen  Post-op Assessment: Report given to RN and Post -op Vital signs reviewed and stable  Post vital signs: Reviewed and stable  Last Vitals:  Vitals Value Taken Time  BP 154/60 08/22/2018 10:30 AM  Temp    Pulse 88 08/22/2018 10:30 AM  Resp 21 08/22/2018 10:30 AM  SpO2 100 % 08/22/2018 10:30 AM  Vitals shown include unvalidated device data.  Last Pain:  Vitals:   08/22/18 0718  TempSrc: Oral  PainSc:       Patients Stated Pain Goal: 7 (18/48/59 2763)  Complications: No apparent anesthesia complications

## 2018-08-22 NOTE — Anesthesia Procedure Notes (Signed)
Procedure Name: Intubation Date/Time: 08/22/2018 8:39 AM Performed by: Glynda Jaeger, CRNA Pre-anesthesia Checklist: Patient identified, Emergency Drugs available, Suction available and Patient being monitored Patient Re-evaluated:Patient Re-evaluated prior to induction Oxygen Delivery Method: Circle System Utilized Preoxygenation: Pre-oxygenation with 100% oxygen Induction Type: IV induction Ventilation: Mask ventilation without difficulty Laryngoscope Size: Mac and 3 Grade View: Grade I Tube type: Oral Tube size: 7.0 mm Number of attempts: 1 Airway Equipment and Method: Stylet and Oral airway Placement Confirmation: ETT inserted through vocal cords under direct vision,  positive ETCO2 and breath sounds checked- equal and bilateral Secured at: 23 cm Tube secured with: Tape Dental Injury: Teeth and Oropharynx as per pre-operative assessment  Comments: Lexi P, SRNA

## 2018-08-23 ENCOUNTER — Encounter (HOSPITAL_COMMUNITY): Payer: Self-pay | Admitting: General Surgery

## 2018-08-23 DIAGNOSIS — E049 Nontoxic goiter, unspecified: Secondary | ICD-10-CM | POA: Diagnosis not present

## 2018-08-23 LAB — BASIC METABOLIC PANEL
Anion gap: 10 (ref 5–15)
BUN: 10 mg/dL (ref 6–20)
CO2: 24 mmol/L (ref 22–32)
Calcium: 9.3 mg/dL (ref 8.9–10.3)
Chloride: 107 mmol/L (ref 98–111)
Creatinine, Ser: 1 mg/dL (ref 0.44–1.00)
GFR calc Af Amer: >60 mL/min (ref >60)
GFR calc non Af Amer: >60 mL/min (ref >60)
GLUCOSE: 162 mg/dL — AB (ref 70–99)
Potassium: 4 mmol/L (ref 3.5–5.1)
Sodium: 141 mmol/L (ref 135–145)

## 2018-08-23 MED ORDER — CALCIUM CARBONATE-VITAMIN D 500-200 MG-UNIT PO TABS: 2.0000 | ORAL_TABLET | Freq: Three times a day (TID) | ORAL | 0 refills | Status: DC

## 2018-08-23 MED ORDER — MAGNESIUM OXIDE 400 (241.3 MG) MG PO TABS: 400.0000 mg | ORAL_TABLET | Freq: Two times a day (BID) | ORAL | 0 refills | Status: AC

## 2018-08-23 MED ORDER — LEVOTHYROXINE SODIUM 100 MCG PO TABS: 100.0000 ug | ORAL_TABLET | Freq: Every day | ORAL | 2 refills | Status: DC

## 2018-08-23 MED FILL — LEVOTHYROXINE 100 MCG TAB: 100 | 30 days supply | Qty: 30 | Fill #0

## 2018-08-23 NOTE — Progress Notes (Signed)
Patient discharged to home. Verbalizes understanding of all discharge instructions including incision care, discharge medications, and follow up MD visits.  

## 2018-08-23 NOTE — Discharge Summary (Signed)
Physician Discharge Summary  [name] MRN: [id number] DOB/AGE: June 18, 1971 47 y.o.  Admit date: 08/22/2018 Discharge date: 08/23/2018  Admission Diagnoses: MNG  Discharge Diagnoses:  Active Problems:   S/P total thyroidectomy   Discharged Condition: good  Hospital Course: Pt was admitted s/p total thyroidectomy.  Pt did well from a pain standpoint post op.  She was tol PO w/o n/v.  She was o/w ambulating well.  POD 1 Ca normal.  She was started on Ca, Mg post op.  Pt was deemed stable for DC and Dc 'd home.  Pt stated she will take OTC ibuprofen for pain.  Consults: None  Significant Diagnostic Studies: none  Treatments: surgery: as above  Discharge Exam: Blood pressure 135/75, pulse 65, temperature 97.8 F (36.6 C), temperature source Oral, resp. rate 18, height 5\' 5"  (1.651 m), weight 76.7 kg, last menstrual period 02/11/2015, SpO2 97 %. General appearance: alert and cooperative Incision/Wound: inc c/d/i  Disposition: Discharge disposition: 01-Home or Self Care       Discharge Instructions    Diet - low sodium heart healthy   Complete by:  As directed    Increase activity slowly   Complete by:  As directed      Allergies as of 08/23/2018      Reactions   Morphine And Related Anaphylaxis   Mushroom Extract Complex Anaphylaxis   Norco [hydrocodone-acetaminophen] Hives   Stomach pain   Tramadol    Stomach pain       Medication List    TAKE these medications   amitriptyline 25 MG tablet Commonly known as:  ELAVIL 25 mg QHS for 7 days, then 50 mg QHS What changed:    how much to take  how to take this  when to take this   atorvastatin 20 MG tablet Commonly known as:  LIPITOR Take 1 tablet (20 mg total) by mouth daily.   b complex vitamins tablet Take 1 tablet by mouth daily.   calcium-vitamin D 500-200 MG-UNIT tablet Commonly known as:  OSCAL WITH D Take 2 tablets by mouth 3 (three) times daily.   cetirizine 10 MG  tablet Commonly known as:  ZYRTEC Take 10 mg by mouth daily.   Diclofenac Potassium 50 MG Pack Take 50 mg by mouth once as needed for up to 1 dose.   famotidine 20 MG tablet Commonly known as:  PEPCID Take 20 mg by mouth at bedtime.   isometheptene-acetaminophen-dichloralphenazone 65-100-325 MG capsule Commonly known as:  MIDRIN 1 cap at onset of headache. May repeat dose in 1 hour. Max: 5 caps in 24 hours   levothyroxine 100 MCG tablet Commonly known as:  SYNTHROID Take 1 tablet (100 mcg total) by mouth daily.   lidocaine 2 % solution Commonly known as:  XYLOCAINE Use as directed 15 mLs in the mouth or throat as needed for mouth pain.   magnesium oxide 400 (241.3 Mg) MG tablet Commonly known as:  MAG-OX Take 1 tablet (400 mg total) by mouth 2 (two) times daily.   methimazole 10 MG tablet Commonly known as:  TAPAZOLE 10 mg PO QD, except Wednesday and Sunday 15 mg PO QD What changed:    how much to take  how to take this  when to take this  additional instructions   omeprazole 20 MG capsule Commonly known as:  PRILOSEC Take 20 mg by mouth daily.   triamcinolone cream 0.5 % Commonly known as:  KENALOG Apply 1 application topically 3 (three) times daily.  What changed:    when to take this  reasons to take this   VITAMIN C PO Take 1 tablet by mouth daily.      Follow-up Information    Ralene Ok, MD. Schedule an appointment as soon as possible for a visit in 2 weeks.   Specialty:  General Surgery Why:  For wound re-check Contact information: [place] [address] [phone]           Signed: Ralene Ok 08/23/2018, 8:23 AM

## 2018-08-24 ENCOUNTER — Telehealth: Payer: Self-pay | Admitting: Allergy & Immunology

## 2018-08-24 DIAGNOSIS — E039 Hypothyroidism, unspecified: Secondary | ICD-10-CM

## 2018-08-27 NOTE — Telephone Encounter (Signed)
Ms. Ehlert recently had thyroid surgery and needs a referral to Endocrinology. Referral placed and routed to the Referral Coordinator.  Salvatore Marvel, MD [facility] [place] [facility] [place]

## 2018-08-27 NOTE — Telephone Encounter (Signed)
Noted.   Salvatore Marvel, MD [facility] and [place] [facility] [place]

## 2018-08-27 NOTE — Telephone Encounter (Signed)
Appt has been scheduled. Patient has been informed.

## 2018-08-28 MED FILL — ATORVASTATIN CALCIUM 20 MG: 20 | 90 days supply | Qty: 90 | Fill #1

## 2018-08-30 ENCOUNTER — Ambulatory Visit: Payer: Self-pay | Admitting: "Endocrinology

## 2018-09-04 MED ORDER — OSELTAMIVIR PHOSPHATE 75 MG PO CAPS: 75.0000 mg | ORAL_CAPSULE | Freq: Two times a day (BID) | ORAL | 0 refills | Status: AC

## 2018-09-04 NOTE — Telephone Encounter (Signed)
Sent in prescription for Tamiflu due to onset of symptoms with known exposure. Confirmed pharmacy with the patient.  Salvatore Marvel, MD [facility] and [place] [facility] [place]

## 2018-09-04 NOTE — Addendum Note (Signed)
Addended by: Valentina Shaggy on: 09/04/2018 09:27 AM   Modules accepted: Orders

## 2018-09-17 ENCOUNTER — Telehealth: Payer: Self-pay | Admitting: Allergy & Immunology

## 2018-09-17 DIAGNOSIS — E041 Nontoxic single thyroid nodule: Secondary | ICD-10-CM

## 2018-09-17 NOTE — Telephone Encounter (Signed)
Orders placed for a TSH for [facility].  Salvatore Marvel, MD Allergy and [place] [facility] [place]

## 2018-09-18 LAB — TSH: TSH: 1.38 u[IU]/mL (ref 0.450–4.500)

## 2018-09-19 MED FILL — LEVOTHYROXINE 100 MCG TAB: 100 | 30 days supply | Qty: 30 | Fill #1

## 2018-09-20 NOTE — Addendum Note (Signed)
Addended by: Valentina Shaggy on: 09/20/2018 11:40 AM   Modules accepted: Orders

## 2018-09-24 MED ORDER — ONDANSETRON 4 MG PO TBDP: 4.0000 mg | ORAL_TABLET | Freq: Three times a day (TID) | ORAL | 1 refills | Status: DC | PRN

## 2018-09-24 NOTE — Telephone Encounter (Signed)
Leslie Valencia reported that she is having a GI viral illness. I will send in Zofran.   Salvatore Marvel, MD [facility] and [place] [facility] [place]

## 2018-09-24 NOTE — Addendum Note (Signed)
Addended by: Valentina Shaggy on: 09/24/2018 12:35 PM   Modules accepted: Orders

## 2018-09-26 ENCOUNTER — Ambulatory Visit: Payer: No Typology Code available for payment source | Admitting: "Endocrinology

## 2018-09-26 ENCOUNTER — Encounter: Payer: Self-pay | Admitting: "Endocrinology

## 2018-09-26 VITALS — BP 125/83 | HR 81 | Ht 64.0 in | Wt 173.0 lb

## 2018-09-26 DIAGNOSIS — E890 Postprocedural hypothyroidism: Secondary | ICD-10-CM | POA: Diagnosis not present

## 2018-09-26 MED ORDER — LEVOTHYROXINE SODIUM 112 MCG PO TABS: 112.0000 ug | ORAL_TABLET | Freq: Every day | ORAL | 2 refills | Status: DC

## 2018-09-26 MED FILL — LEVOTHYROXINE 112 MCG TAB: 112 | 90 days supply | Qty: 90 | Fill #0

## 2018-09-26 NOTE — Progress Notes (Signed)
Endocrinology Consult Note                                            09/26/2018, 1:04 PM   Subjective:    [name], female    DOB: 06-22-1971, PCP Ma Hillock, DO   Past Medical History:  Diagnosis Date  . Allergy   . Asthma    patient denies  . Bronchitis   . Esophagitis   . GERD (gastroesophageal reflux disease)   . History of kidney stones   . Hyperlipidemia   . Hyperthyroidism   . Migraine   . Multinodular goiter   . Seasonal allergies   . Vaginal delivery    x2   Past Surgical History:  Procedure Laterality Date  . CHOLECYSTECTOMY  2005  . THYROIDECTOMY  08/22/2018  . THYROIDECTOMY N/A 08/22/2018   Procedure: TOTAL THYROIDECTOMY;  Surgeon: Ralene Ok, MD;  Location: [place];  Service: General;  Laterality: N/A;   Social History   Socioeconomic History  . Marital status: Married    Spouse name: Not on file  . Number of children: Not on file  . Years of education: Not on file  . Highest education level: Not on file  Occupational History  . Not on file  Social Needs  . Financial resource strain: Not on file  . Food insecurity:    Worry: Not on file    Inability: Not on file  . Transportation needs:    Medical: Not on file    Non-medical: Not on file  Tobacco Use  . Smoking status: Former Smoker    Last attempt to quit: 2004    Years since quitting: 16.0  . Smokeless tobacco: Never Used  Substance and Sexual Activity  . Alcohol use: Yes    Comment: occasional  . Drug use: No  . Sexual activity: Yes    Partners: Male    Comment: married  Lifestyle  . Physical activity:    Days per week: Not on file    Minutes per session: Not on file  . Stress: Not on file  Relationships  . Social connections:    Talks on phone: Not on file    Gets together: Not on file    Attends religious service: Not on file    Active member of club or organization: Not on file    Attends meetings of clubs or organizations: Not on  file    Relationship status: Not on file  Other Topics Concern  . Not on file  Social History Narrative   Marital status/children/pets: married, 2 children.    Education/employment: BSN   Safety:      -Wears a bicycle helmet riding a bike: Yes     -smoke alarm in the home:Yes     - wears seatbelt: Yes     - Feels safe in their relationships: Yes   Outpatient Encounter Medications as of 09/26/2018  Medication Sig  . amitriptyline (ELAVIL) 25 MG tablet 25 mg QHS for 7 days, then 50 mg QHS (Patient taking differently: Take 25 mg by mouth at bedtime. 25 mg QHS for 7 days, then 50 mg QHS)  . Ascorbic Acid (VITAMIN C PO) Take 1 tablet by mouth daily.  Marland Kitchen atorvastatin (LIPITOR) 20 MG tablet Take 1 tablet (20 mg total) by mouth daily.  Marland Kitchen  b complex vitamins tablet Take 1 tablet by mouth daily.  . cetirizine (ZYRTEC) 10 MG tablet Take 10 mg by mouth daily.  . famotidine (PEPCID) 20 MG tablet Take 20 mg by mouth at bedtime.  Marland Kitchen levothyroxine (SYNTHROID, LEVOTHROID) 112 MCG tablet Take 1 tablet (112 mcg total) by mouth daily before breakfast.  . lidocaine (XYLOCAINE) 2 % solution Use as directed 15 mLs in the mouth or throat as needed for mouth pain.  . magnesium oxide (MAG-OX) 400 (241.3 Mg) MG tablet Take 1 tablet (400 mg total) by mouth 2 (two) times daily.  Marland Kitchen omeprazole (PRILOSEC) 20 MG capsule Take 20 mg by mouth daily.  . ondansetron (ZOFRAN ODT) 4 MG disintegrating tablet Take 1 tablet (4 mg total) by mouth every 8 (eight) hours as needed for nausea or vomiting.  . triamcinolone cream (KENALOG) 0.5 % Apply 1 application topically 3 (three) times daily. (Patient taking differently: Apply 1 application topically daily as needed (rash). )  . [DISCONTINUED] calcium-vitamin D (OSCAL WITH D) 500-200 MG-UNIT tablet Take 2 tablets by mouth 3 (three) times daily.  . [DISCONTINUED] Diclofenac Potassium 50 MG PACK Take 50 mg by mouth once as needed for up to 1 dose. (Patient not taking: Reported on  08/13/2018)  . [DISCONTINUED] isometheptene-acetaminophen-dichloralphenazone (MIDRIN) 65-100-325 MG capsule 1 cap at onset of headache. May repeat dose in 1 hour. Max: 5 caps in 24 hours (Patient not taking: Reported on 08/13/2018)  . [DISCONTINUED] levothyroxine (SYNTHROID) 100 MCG tablet Take 1 tablet (100 mcg total) by mouth daily.  . [DISCONTINUED] levothyroxine (SYNTHROID, LEVOTHROID) 112 MCG tablet Take 1 tablet (112 mcg total) by mouth daily before breakfast.  . [DISCONTINUED] methimazole (TAPAZOLE) 10 MG tablet 10 mg PO QD, except Wednesday and Sunday 15 mg PO QD (Patient taking differently: Take 5-10 mg by mouth See admin instructions. Take 10 mg by mouth nightly except Wednesday and Sunday take 15 mg)   No facility-administered encounter medications on file as of 09/26/2018.    ALLERGIES: Allergies  Allergen Reactions  . Morphine And Related Anaphylaxis  . Mushroom Extract Complex Anaphylaxis  . Norco [Hydrocodone-Acetaminophen] Hives    Stomach pain  . Tramadol     Stomach pain     VACCINATION STATUS: Immunization History  Administered Date(s) Administered  . Influenza-Unspecified 06/06/2018    HPI Leslie Valencia is 48 y.o. female who presents today with a medical history as above. she is being seen in consultation for postsurgical hypothyroidism requested by Howard Pouch A, DO.   Her history includes longstanding toxic multinodular goiter for which she underwent total thyroidectomy on December 07/2018.  She is recovering from her surgery very well.  She was initiated on levothyroxine 100 mcg p.o. every morning.  She reports compliance to his medication.  Her TSH on September 17, 2018 was 1.38.  She required transient calcium supplement following her surgery.  She has not taken calcium for more than a week after her lab work reportedly showed above normal calcium levels.  She still has voice hoarseness, which she had even prior to her surgery. -She reports progressive weight  gain, more after her surgery.  Denies cold intolerance, palpitations, nor tremors.  Her surgical sample was significant for benign nodular goiter.  She has no family history of thyroid malignancy.    Review of Systems  Constitutional: + weight gain, no fatigue, no subjective hyperthermia, no subjective hypothermia Eyes: no blurry vision, no xerophthalmia ENT: no sore throat, no nodules palpated in throat, no dysphagia/odynophagia, +  hoarseness-following with ENT. Cardiovascular: no Chest Pain, no Shortness of Breath, no palpitations, no leg swelling Respiratory: no cough, no SOB Gastrointestinal: no Nausea/Vomiting/Diarhhea Musculoskeletal: no muscle/joint aches Skin: no rashes Neurological: no tremors, no numbness, no tingling, no dizziness Psychiatric: no depression, no anxiety  Objective:    BP 125/83   Pulse 81   Ht 5\' 4"  (1.626 m)   Wt 173 lb (78.5 kg)   LMP 02/11/2015 (Approximate)   BMI 29.70 kg/m   Wt Readings from Last 3 Encounters:  09/26/18 173 lb (78.5 kg)  08/22/18 169 lb 1.5 oz (76.7 kg)  08/15/18 169 lb 1.6 oz (76.7 kg)    Physical Exam  Constitutional: + Over weight for height, not in acute distress, normal state of mind Eyes: PERRLA, EOMI, no exophthalmos ENT: moist mucous membranes, + healing post thyroidectomy scar on anterior lower neck,  no cervical lymphadenopathy Cardiovascular: normal precordial activity, Regular Rate and Rhythm, no Murmur/Rubs/Gallops Respiratory:  adequate breathing efforts, no gross chest deformity, Clear to auscultation bilaterally Gastrointestinal: abdomen soft, Non -tender, No distension, Bowel Sounds present Musculoskeletal: no gross deformities, strength intact in all four extremities Skin: moist, warm, no rashes Neurological: no tremor with outstretched hands, Deep tendon reflexes normal in all four extremities.   CMP     Component Value Date/Time   NA 141 08/23/2018 0144   K 4.0 08/23/2018 0144   CL 107 08/23/2018  0144   CO2 24 08/23/2018 0144   GLUCOSE 162 (H) 08/23/2018 0144   BUN 10 08/23/2018 0144   CREATININE 1.00 08/23/2018 0144   CALCIUM 10.0 09/17/2018 1211   GFRNONAA >60 08/23/2018 0144   GFRAA >60 08/23/2018 0144   Lipid Panel     Component Value Date/Time   CHOL 293 (H) 03/13/2018 0930   TRIG 179 (H) 03/13/2018 0930   HDL 65 03/13/2018 0930   CHOLHDL 4.5 (H) 03/13/2018 0930   LDLCALC 192 (H) 03/13/2018 0930      Lab Results  Component Value Date   TSH 1.380 09/17/2018     Assessment & Plan:   1. Postsurgical hypothyroidism 2.  Toxic multinodular goiter-resolved  - Leslie Valencia  is being seen at a kind request of Kuneff, Renee A, DO. - I have reviewed her available endocrinology records and clinically evaluated the patient. - Based on reviews, she has postsurgical hypothyroidism.  Total thyroidectomy performed for chronic toxic multinodular goiter.  Surgical sample negative for malignancy.  -Based on her current condition, she would benefit from slight increase in the dose of her levothyroxine.  I discussed and increase her levothyroxine to 112 mcg daily before breakfast.  - We discussed about correct intake of levothyroxine, at fasting, with water, separated by at least 30 minutes from breakfast, and separated by more than 4 hours from calcium, iron, multivitamins, acid reflux medications (PPIs). -Patient is made aware of the fact that thyroid hormone replacement is needed for life, dose to be adjusted by periodic monitoring of thyroid function tests.    -Her next labs will include CMP to monitor calcium.  She is advised to continue follow-up with ENT regarding voice hoarseness.  - I advised her  to maintain close follow up with Raoul Pitch, Renee A, DO for primary care needs.   - Time spent with the patient: 35 minutes, of which >50% was spent in obtaining information about her symptoms, reviewing her previous labs, previous imaging studies, evaluations, and  treatments, counseling her about her postsurgical hypothyroidism, and developing a plan to confirm the  diagnosis and long term treatment as necessary.  Leslie Valencia participated in the discussions, expressed understanding, and voiced agreement with the above plans.  All questions were answered to her satisfaction. she is encouraged to contact clinic should she have any questions or concerns prior to her return visit.  Follow up plan: Return in about 3 months (around 12/26/2018) for Follow up with Pre-visit Labs.   Glade Lloyd, MD [facility] [address] [place], [place] [address] Phone: [phone]  Fax: [phone]     09/26/2018, 1:04 PM  This note was partially dictated with voice recognition software. Similar sounding words can be transcribed inadequately or may not  be corrected upon review.

## 2018-10-05 LAB — CALCIUM: CALCIUM: 10 mg/dL (ref 8.7–10.2)

## 2018-10-18 ENCOUNTER — Telehealth: Payer: Self-pay | Admitting: Allergy & Immunology

## 2018-10-18 MED ORDER — DOXYCYCLINE HYCLATE 100 MG PO TABS: 100.0000 mg | ORAL_TABLET | Freq: Two times a day (BID) | ORAL | 0 refills | Status: AC

## 2018-10-18 NOTE — Telephone Encounter (Signed)
The patient called me and she has been battling sinus pressure and postnasal drip for 5 days now.  She has not had a fever.  There are multiple sick contacts in her office.  She declines prednisone.  Therefore I will send in doxycycline 100 mg twice daily for 10 days.  Confirm pharmacy with the patient.  Salvatore Marvel, MD Allergy and [place] [facility] [place]

## 2018-10-24 MED FILL — OMEPRAZOLE 20 MG CPDR: 20 | 90 days supply | Qty: 90 | Fill #1

## 2018-10-24 MED FILL — FAMOTIDINE 20 MG TABS: 20 | 90 days supply | Qty: 90 | Fill #1

## 2018-10-24 MED FILL — AMITRIPTYLINE HCL 25 MG TAB: 25 | 90 days supply | Qty: 180 | Fill #1

## 2018-10-26 ENCOUNTER — Telehealth: Payer: Self-pay | Admitting: Allergy & Immunology

## 2018-10-26 MED ORDER — ONDANSETRON 4 MG PO TBDP: 4.0000 mg | ORAL_TABLET | Freq: Three times a day (TID) | ORAL | 1 refills | Status: DC | PRN

## 2018-10-26 NOTE — Telephone Encounter (Signed)
Patient has n/v with a GI illness that is traveling through the family. Sent in Zofran ODT.  Salvatore Marvel, MD Allergy and [place] [facility] [place]

## 2018-11-28 MED FILL — ATORVASTATIN 20 MG TABLET: 20 | 90 days supply | Qty: 90 | Fill #2

## 2018-12-10 ENCOUNTER — Telehealth: Payer: Self-pay | Admitting: Allergy & Immunology

## 2018-12-10 DIAGNOSIS — Z20828 Contact with and (suspected) exposure to other viral communicable diseases: Secondary | ICD-10-CM

## 2018-12-10 DIAGNOSIS — Z20822 Contact with and (suspected) exposure to covid-19: Secondary | ICD-10-CM

## 2018-12-10 NOTE — Telephone Encounter (Signed)
Ms. Leslie Valencia reported exposure to her daughter who has a high grade fever with mild flu like symptoms. Therefore I will order COVID-19 testing to confirm that this is negative.  Salvatore Marvel, MD Allergy and [place] [facility] [place]

## 2018-12-13 LAB — NOVEL CORONAVIRUS, NAA: SARS-CoV-2, NAA: NOT DETECTED

## 2018-12-21 MED FILL — LEVOTHYROXINE 112 MCG TAB: 112 | 90 days supply | Qty: 90 | Fill #1

## 2018-12-21 MED FILL — OMEPRAZOLE 20 MG CPDR: 20 | 90 days supply | Qty: 90 | Fill #2

## 2018-12-21 MED FILL — FAMOTIDINE 20 MG TABS: 20 | 30 days supply | Qty: 30 | Fill #2

## 2018-12-21 MED FILL — AMITRIPTYLINE HCL 25 MG TAB: 25 | 90 days supply | Qty: 180 | Fill #2

## 2019-01-02 ENCOUNTER — Ambulatory Visit: Payer: No Typology Code available for payment source | Admitting: "Endocrinology

## 2019-01-09 ENCOUNTER — Telehealth: Payer: Self-pay | Admitting: Allergy & Immunology

## 2019-01-09 DIAGNOSIS — E039 Hypothyroidism, unspecified: Secondary | ICD-10-CM

## 2019-01-09 DIAGNOSIS — E782 Mixed hyperlipidemia: Secondary | ICD-10-CM

## 2019-01-09 NOTE — Telephone Encounter (Signed)
Labs for T3 and lipid panel placed. Patient aware.   Salvatore Marvel, MD Allergy and [place] [facility] [place]

## 2019-01-10 LAB — COMPREHENSIVE METABOLIC PANEL
ALBUMIN: 4.8 g/dL (ref 3.8–4.8)
ALT: 44 IU/L — ABNORMAL HIGH (ref 0–32)
AST: 27 IU/L (ref 0–40)
Albumin/Globulin Ratio: 2.2 (ref 1.2–2.2)
Alkaline Phosphatase: 134 IU/L — ABNORMAL HIGH (ref 39–117)
BUN/Creatinine Ratio: 9 (ref 9–23)
BUN: 9 mg/dL (ref 6–24)
Bilirubin Total: 0.4 mg/dL (ref 0.0–1.2)
CALCIUM: 10.1 mg/dL (ref 8.7–10.2)
CO2: 22 mmol/L (ref 20–29)
Chloride: 101 mmol/L (ref 96–106)
Creatinine, Ser: 1.04 mg/dL — ABNORMAL HIGH (ref 0.57–1.00)
GFR calc Af Amer: 74 mL/min/{1.73_m2} (ref >59)
GFR, EST NON AFRICAN AMERICAN: 64 mL/min/{1.73_m2} (ref >59)
Globulin, Total: 2.2 g/dL (ref 1.5–4.5)
Glucose: 101 mg/dL — ABNORMAL HIGH (ref 65–99)
Potassium: 4.3 mmol/L (ref 3.5–5.2)
Sodium: 144 mmol/L (ref 134–144)
Total Protein: 7 g/dL (ref 6.0–8.5)

## 2019-01-10 LAB — LIPID PANEL W/O CHOL/HDL RATIO
Cholesterol, Total: 194 mg/dL (ref 100–199)
HDL: 63 mg/dL (ref >39)
LDL Calculated: 102 mg/dL — ABNORMAL HIGH (ref 0–99)
Triglycerides: 147 mg/dL (ref 0–149)
VLDL Cholesterol Cal: 29 mg/dL (ref 5–40)

## 2019-01-10 LAB — T4, FREE: Free T4: 1.45 ng/dL (ref 0.82–1.77)

## 2019-01-10 LAB — TSH: TSH: 0.134 u[IU]/mL — ABNORMAL LOW (ref 0.450–4.500)

## 2019-01-10 LAB — T3: T3, Total: 113 ng/dL (ref 71–180)

## 2019-01-11 ENCOUNTER — Ambulatory Visit: Payer: No Typology Code available for payment source | Admitting: "Endocrinology

## 2019-01-11 ENCOUNTER — Other Ambulatory Visit: Payer: Self-pay

## 2019-01-18 ENCOUNTER — Ambulatory Visit (INDEPENDENT_AMBULATORY_CARE_PROVIDER_SITE_OTHER): Payer: No Typology Code available for payment source | Admitting: "Endocrinology

## 2019-01-18 ENCOUNTER — Other Ambulatory Visit: Payer: Self-pay

## 2019-01-18 ENCOUNTER — Encounter: Payer: Self-pay | Admitting: "Endocrinology

## 2019-01-18 DIAGNOSIS — E782 Mixed hyperlipidemia: Secondary | ICD-10-CM | POA: Diagnosis not present

## 2019-01-18 DIAGNOSIS — E890 Postprocedural hypothyroidism: Secondary | ICD-10-CM | POA: Diagnosis not present

## 2019-01-18 MED ORDER — LEVOTHYROXINE SODIUM 100 MCG PO TABS: 100.0000 ug | ORAL_TABLET | Freq: Every day | ORAL | 6 refills | Status: DC

## 2019-01-18 MED FILL — LEVOTHYROXINE 100 MCG TAB: 100 | 30 days supply | Qty: 30 | Fill #0

## 2019-01-18 NOTE — Progress Notes (Signed)
01/18/2019, 10:46 AM                                 Endocrinology Telehealth Visit Follow up Note -During COVID -19 Pandemic  I connected with Leslie Valencia on 01/18/2019   by WebEx  and verified that I am speaking with the correct person using two identifiers. Leslie Valencia, [birth year]. she has verbally consented to this visit. All issues noted in this document were discussed and addressed. The format was not optimal for physical exam.   Subjective:    [name], female    DOB: 1971-06-21, PCP Ma Hillock, DO   Past Medical History:  Diagnosis Date  . Allergy   . Asthma    patient denies  . Bronchitis   . Esophagitis   . GERD (gastroesophageal reflux disease)   . History of kidney stones   . Hyperlipidemia   . Hyperthyroidism   . Migraine   . Multinodular goiter   . Seasonal allergies   . Vaginal delivery    x2   Past Surgical History:  Procedure Laterality Date  . CHOLECYSTECTOMY  2005  . THYROIDECTOMY  08/22/2018  . THYROIDECTOMY N/A 08/22/2018   Procedure: TOTAL THYROIDECTOMY;  Surgeon: Ralene Ok, MD;  Location: [facility];  Service: General;  Laterality: N/A;   Social History   Socioeconomic History  . Marital status: Married    Spouse name: Not on file  . Number of children: Not on file  . Years of education: Not on file  . Highest education level: Not on file  Occupational History  . Not on file  Social Needs  . Financial resource strain: Not on file  . Food insecurity:    Worry: Not on file    Inability: Not on file  . Transportation needs:    Medical: Not on file    Non-medical: Not on file  Tobacco Use  . Smoking status: Former Smoker    Last attempt to quit: 2004    Years since quitting: 16.3  . Smokeless tobacco: Never Used  Substance and Sexual Activity  . Alcohol use: Yes    Comment: occasional  . Drug use: No  . Sexual activity: Yes    Partners: Male   Comment: married  Lifestyle  . Physical activity:    Days per week: Not on file    Minutes per session: Not on file  . Stress: Not on file  Relationships  . Social connections:    Talks on phone: Not on file    Gets together: Not on file    Attends religious service: Not on file    Active member of club or organization: Not on file    Attends meetings of clubs or organizations: Not on file    Relationship status: Not on file  Other Topics Concern  . Not on file  Social History Narrative   Marital status/children/pets: married, 2 children.    Education/employment: BSN   Safety:      -Wears a bicycle helmet riding a bike: Yes     -smoke alarm in the home:Yes     -  wears seatbelt: Yes     - Feels safe in their relationships: Yes   Outpatient Encounter Medications as of 01/18/2019  Medication Sig  . amitriptyline (ELAVIL) 25 MG tablet 25 mg QHS for 7 days, then 50 mg QHS (Patient taking differently: Take 25 mg by mouth at bedtime. 25 mg QHS for 7 days, then 50 mg QHS)  . Ascorbic Acid (VITAMIN C PO) Take 1 tablet by mouth daily.  Marland Kitchen atorvastatin (LIPITOR) 20 MG tablet Take 1 tablet (20 mg total) by mouth daily.  Marland Kitchen b complex vitamins tablet Take 1 tablet by mouth daily.  . cetirizine (ZYRTEC) 10 MG tablet Take 10 mg by mouth daily.  . famotidine (PEPCID) 20 MG tablet Take 20 mg by mouth at bedtime.  Marland Kitchen levothyroxine (SYNTHROID) 100 MCG tablet Take 1 tablet (100 mcg total) by mouth daily before breakfast.  . lidocaine (XYLOCAINE) 2 % solution Use as directed 15 mLs in the mouth or throat as needed for mouth pain.  . magnesium oxide (MAG-OX) 400 (241.3 Mg) MG tablet Take 1 tablet (400 mg total) by mouth 2 (two) times daily.  Marland Kitchen omeprazole (PRILOSEC) 20 MG capsule Take 20 mg by mouth daily.  . ondansetron (ZOFRAN ODT) 4 MG disintegrating tablet Take 1 tablet (4 mg total) by mouth every 8 (eight) hours as needed for nausea or vomiting.  . triamcinolone cream (KENALOG) 0.5 % Apply 1  application topically 3 (three) times daily. (Patient taking differently: Apply 1 application topically daily as needed (rash). )  . [DISCONTINUED] levothyroxine (SYNTHROID, LEVOTHROID) 112 MCG tablet Take 1 tablet (112 mcg total) by mouth daily before breakfast.   No facility-administered encounter medications on file as of 01/18/2019.    ALLERGIES: Allergies  Allergen Reactions  . Morphine And Related Anaphylaxis  . Mushroom Extract Complex Anaphylaxis  . Norco [Hydrocodone-Acetaminophen] Hives    Stomach pain  . Tramadol     Stomach pain     VACCINATION STATUS: Immunization History  Administered Date(s) Administered  . Influenza-Unspecified 06/06/2018    HPI Leslie Valencia is 48 y.o. female who is engaged in telehealth via WebEx to follow-up for her postsurgical hypothyroidism.    PCP: Raoul Pitch, Renee A, DO.   Her history includes longstanding toxic multinodular goiter for which she underwent total thyroidectomy on December 07/2018.  She has recovered from her surgery very well.  She was initiated on levothyroxine subsequent to her surgery, currently on 112 mcg p.o. nightly.  Her previsit labs show slight over replacement.  Clinically she reports increased appetite, increased body weight.  She denies palpitations, tremors, heat intolerance.   -Her voice is getting better.  Her surgical sample was significant for benign nodular goiter.  She has no family history of thyroid malignancy.   Review of Systems  Limited as above.  Objective:    LMP 02/11/2015 (Approximate)   Wt Readings from Last 3 Encounters:  09/26/18 173 lb (78.5 kg)  08/22/18 169 lb 1.5 oz (76.7 kg)  08/15/18 169 lb 1.6 oz (76.7 kg)    Physical Exam  CMP     Component Value Date/Time   NA 144 01/09/2019 1007   K 4.3 01/09/2019 1007   CL 101 01/09/2019 1007   CO2 22 01/09/2019 1007   GLUCOSE 101 (H) 01/09/2019 1007   GLUCOSE 162 (H) 08/23/2018 0144   BUN 9 01/09/2019 1007   CREATININE 1.04 (H)  01/09/2019 1007   CALCIUM 10.1 01/09/2019 1007   PROT 7.0 01/09/2019 1007   ALBUMIN 4.8  01/09/2019 1007   AST 27 01/09/2019 1007   ALT 44 (H) 01/09/2019 1007   ALKPHOS 134 (H) 01/09/2019 1007   BILITOT 0.4 01/09/2019 1007   GFRNONAA 64 01/09/2019 1007   GFRAA 74 01/09/2019 1007   Lipid Panel     Component Value Date/Time   CHOL 194 01/09/2019 1006   TRIG 147 01/09/2019 1006   HDL 63 01/09/2019 1006   CHOLHDL 4.5 (H) 03/13/2018 0930   LDLCALC 102 (H) 01/09/2019 1006     Lab Results  Component Value Date   TSH 0.134 (L) 01/09/2019   TSH 1.380 09/17/2018   FREET4 1.45 01/09/2019     Assessment & Plan:   1. Postsurgical hypothyroidism 2.  Toxic multinodular goiter-resolved   she has postsurgical hypothyroidism.  Total thyroidectomy performed for chronic toxic multinodular goiter.  Surgical sample negative for malignancy. -Her previsit thyroid function tests are consistent with slight over replacement.  I discussed and lowered her levothyroxine to 100 mcg daily before breakfast.   - We discussed about the correct intake of her thyroid hormone, on empty stomach at fasting, with water, separated by at least 30 minutes from breakfast and other medications,  and separated by more than 4 hours from calcium, iron, multivitamins, acid reflux medications (PPIs). -Patient is made aware of the fact that thyroid hormone replacement is needed for life, dose to be adjusted by periodic monitoring of thyroid function tests.  -She has premature menopause, at age 29.  She will be considered for a bone density on her subsequent visits. -She is advised to continue on her atorvastatin 20 mg p.o. nightly for mixed hyperlipidemia. - I advised her  to maintain close follow up with Raoul Pitch, Renee A, DO for primary care needs.  Time for this visit 15 minutes.  Leslie Valencia participated in the discussions, expressed understanding, and voiced agreement with the above plans.  All questions were  answered to her satisfaction. she is encouraged to contact clinic should she have any questions or concerns prior to her return visit.   Follow up plan: Return in about 6 months (around 07/21/2019).   Glade Lloyd, MD [facility] [address] [place], [place] [address] Phone: [phone]  Fax: [phone]     01/18/2019, 10:46 AM  This note was partially dictated with voice recognition software. Similar sounding words can be transcribed inadequately or may not  be corrected upon review.

## 2019-01-21 ENCOUNTER — Telehealth: Payer: No Typology Code available for payment source | Admitting: Family Medicine

## 2019-01-21 ENCOUNTER — Other Ambulatory Visit: Payer: Self-pay

## 2019-01-25 MED FILL — SM ACID REDUCER 10 MG TABS: 10 | 30 days supply | Qty: 60 | Fill #0

## 2019-02-07 ENCOUNTER — Telehealth: Payer: Self-pay | Admitting: Allergy & Immunology

## 2019-02-07 MED ORDER — LEVOCETIRIZINE DIHYDROCHLORIDE 5 MG PO TABS: 5.0000 mg | ORAL_TABLET | Freq: Every evening | ORAL | 3 refills | Status: DC

## 2019-02-07 MED FILL — LEVOCETIRIZINE 5 MG TABLET: 5 | 90 days supply | Qty: 90 | Fill #0

## 2019-02-07 NOTE — Telephone Encounter (Signed)
Patient reports that the [place] worked well for her rhinitis symptoms.  She requests that a prescription be sent to [facility].  I sent in a 90-day supply with 3 refills.  Salvatore Marvel, MD Allergy and [place] of [place]

## 2019-02-11 MED FILL — LEVOTHYROXINE 100 MCG TAB: 100 | 30 days supply | Qty: 30 | Fill #1

## 2019-02-18 MED FILL — SM ACID REDUCER 10 MG TABS: 10 | 30 days supply | Qty: 60 | Fill #1

## 2019-02-20 ENCOUNTER — Telehealth: Payer: Self-pay | Admitting: Family Medicine

## 2019-02-20 NOTE — Telephone Encounter (Signed)
Patient requesting to have labs prior to her appointment.  Do not see evidence patient has an appointment scheduled as of yet.  -She had recent labs collected at her place of employment with a CMP, lipids and thyroid 01/09/2019. These will not need repeated  -If she would like to have labs collected early she can have her CBC and A1c collected at [facility] if that is her preference. MUST schedule a CPE first and then we can place the above mentioned orders as long as she agrees and states she understands the reasons we no longer provide pre-lab appts (below).   Reasons why we no longer routinely complete labs prior to an appt: 1.Some insurances have started to refuse payment on preventative labs completed outside of the preventative office visit service date. If this occurs- she will have to pay out of pocket for those labs.  2. We can only order the routine labs, anything additional discussed during the visit requiring labs will require another lab draw. Prevents double sticks.

## 2019-02-21 NOTE — Telephone Encounter (Signed)
Pt was sent my chart message with the following information

## 2019-02-22 ENCOUNTER — Other Ambulatory Visit: Payer: Self-pay | Admitting: Allergy & Immunology

## 2019-02-22 DIAGNOSIS — E782 Mixed hyperlipidemia: Secondary | ICD-10-CM

## 2019-02-22 MED FILL — ATORVASTATIN 20 MG TABLET: 20 | 90 days supply | Qty: 90 | Fill #0

## 2019-03-15 MED FILL — LEVOTHYROXINE 100 MCG TAB: 100 | 30 days supply | Qty: 30 | Fill #2

## 2019-03-18 ENCOUNTER — Encounter: Payer: Self-pay | Admitting: Emergency Medicine

## 2019-03-18 ENCOUNTER — Other Ambulatory Visit: Payer: Self-pay

## 2019-03-18 ENCOUNTER — Ambulatory Visit: Payer: Self-pay

## 2019-03-18 ENCOUNTER — Emergency Department
Admission: EM | Admit: 2019-03-18 | Discharge: 2019-03-18 | Disposition: A | Discharge status: Home / Self Care | Payer: Self-pay | Source: Home / Self Care

## 2019-03-18 DIAGNOSIS — G510 Bell's palsy: Secondary | ICD-10-CM | POA: Diagnosis not present

## 2019-03-18 NOTE — Telephone Encounter (Signed)
Forward to [place] Team

## 2019-03-18 NOTE — ED Provider Notes (Signed)
[name]    CSN: [id number] Arrival date & time: 03/18/19  1443     History   Chief Complaint Chief Complaint  Patient presents with  . asymmetrical face    HPI Leslie Valencia is a 48 y.o. female.   HPI  Leslie Valencia is a 48 y.o. female presenting to UC with c/o Left side facial droop that started today. She had mild discomfort in her mouth yesterday but coworkers noticed she had some drooping on the Left side of her face.  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 Pt denies any other symptoms. Denies HA, change in vision, congestion, weakness in her arms or legs. No trouble walking. No reports of confusion or slurred speech.  Pt does report having a hx of migraines as well as a facial nerve issue on her Left side but she has never had symptoms like this in the past. She has been more stressed at work recently but no recent URI symptoms.   Past Medical History:  Diagnosis Date  . Allergy   . Asthma    patient denies  . Bronchitis   . Esophagitis   . GERD (gastroesophageal reflux disease)   . History of kidney stones   . Hyperlipidemia   . Hyperthyroidism   . Migraine   . Multinodular goiter   . Seasonal allergies   . Vaginal delivery    x2    Patient Active Problem List   Diagnosis Date Noted  . Postsurgical hypothyroidism 09/26/2018  . S/P total thyroidectomy 08/22/2018  . Hoarseness 04/12/2018  . Chronic migraine without aura without status migrainosus, not intractable 04/12/2018  . Overweight (BMI 25.0-29.9) 04/12/2018  . Vegetarian diet 04/12/2018  . Mixed hyperlipidemia 04/12/2018    Past Surgical History:  Procedure Laterality Date  . CHOLECYSTECTOMY  2005  . THYROIDECTOMY  08/22/2018  . THYROIDECTOMY N/A 08/22/2018   Procedure: TOTAL THYROIDECTOMY;  Surgeon: Ralene Ok, MD;  Location: [facility];  Service: General;  Laterality: N/A;    OB History    Gravida  2   Para   2   Term      Preterm      AB      Living  2     SAB      TAB      Ectopic      Multiple      Live Births               Home Medications    Prior to Admission medications   Medication Sig Start Date End Date Taking? Authorizing Provider  amitriptyline (ELAVIL) 25 MG tablet 25 mg QHS for 7 days, then 50 mg QHS Patient taking differently: Take 25 mg by mouth at bedtime. 25 mg QHS for 7 days, then 50 mg QHS 05/15/18   Valentina Shaggy, MD  Ascorbic Acid (VITAMIN C PO) Take 1 tablet by mouth daily.    [provider]  atorvastatin (LIPITOR) 20 MG tablet TAKE 1 TABLET BY MOUTH ONCE DAILY 02/22/19   Valentina Shaggy, MD  b complex vitamins tablet Take 1 tablet by mouth daily.    [provider]  famotidine (PEPCID) 20 MG tablet Take 20 mg by mouth at bedtime. 08/03/18   [provider]  levothyroxine (SYNTHROID) 100 MCG tablet Take 1 tablet (100 mcg total) by mouth daily before breakfast. 01/18/19   Nida, Marella Chimes, MD  lidocaine (XYLOCAINE) 2 % solution Use as directed 15 mLs in the mouth or throat as needed for mouth pain. 03/21/18   Valentina Shaggy, MD  magnesium oxide (MAG-OX) 400 (241.3 Mg) MG tablet Take 1 tablet (400 mg total) by mouth 2 (two) times daily. 08/23/18   Ralene Ok, MD  omeprazole (PRILOSEC) 20 MG capsule Take 20 mg by mouth daily.    [provider]  triamcinolone cream (KENALOG) 0.5 % Apply 1 application topically 3 (three) times daily. Patient taking differently: Apply 1 application topically daily as needed (rash).  05/05/17   Valentina Shaggy, MD    Family History Family History  Problem Relation Age of Onset  . Migraines Mother   . Alcohol abuse Father   . Arthritis Father   . Depression Father   . Early death Father   . Heart disease Maternal Grandmother   . Hyperlipidemia Maternal Grandmother     Social History Social History   Tobacco Use  . Smoking status: Former Smoker     Quit date: 2004    Years since quitting: 16.5  . Smokeless tobacco: Never Used  Substance Use Topics  . Alcohol use: Yes    Comment: occasional  . Drug use: No     Allergies   Morphine and related, Mushroom extract complex, Norco [hydrocodone-acetaminophen], and Tramadol   Review of Systems Review of Systems  Constitutional: Negative for chills and fever.  HENT: Negative for congestion, ear pain, sore throat, trouble swallowing and voice change.   Respiratory: Negative for cough and shortness of breath.   Cardiovascular: Negative for chest pain and palpitations.  Gastrointestinal: Negative for abdominal pain, diarrhea, nausea and vomiting.  Musculoskeletal: Negative for arthralgias, back pain and myalgias.  Skin: Negative for rash.  Neurological: Positive for facial asymmetry. Negative for dizziness, tremors, seizures, syncope, speech difficulty, weakness, light-headedness, numbness and headaches.     Physical Exam Triage Vital Signs ED Triage Vitals  Enc Vitals Group     BP 03/18/19 1506 122/83     Pulse Rate 03/18/19 1506 65     Resp --      Temp 03/18/19 1506 97.9 F (36.6 C)     Temp Source 03/18/19 1506 Oral     SpO2 03/18/19 1506 97 %     Weight 03/18/19 1507 180 lb (81.6 kg)     Height 03/18/19 1507 5\' 5"  (1.651 m)     Head Circumference --      Peak Flow --      Pain Score 03/18/19 1507 0     Pain Loc --      Pain Edu? --      Excl. in [place]? --    No data found.  Updated Vital Signs BP 122/83 (BP Location: Right Arm)   Pulse 65   Temp 97.9 F (36.6 C) (Oral)   Ht 5\' 5"  (1.651 m)   Wt 180 lb (81.6 kg)   LMP 02/11/2015 (Approximate)   SpO2 97%   BMI 29.95 kg/m   Visual Acuity Right Eye Distance:   Left Eye Distance:   Bilateral Distance:    Right Eye Near:   Left Eye Near:    Bilateral Near:     Physical Exam Vitals signs and nursing note reviewed.  Constitutional:      Appearance: Normal appearance. She is well-developed.  HENT:      Head: Normocephalic and atraumatic.     Right Ear: Tympanic membrane normal.     Left  Ear: Tympanic membrane normal.     Nose: Nose normal.     Right Sinus: No maxillary sinus tenderness or frontal sinus tenderness.     Left Sinus: No maxillary sinus tenderness or frontal sinus tenderness.     Mouth/Throat:     Lips: Pink.     Mouth: Mucous membranes are moist.     Pharynx: Oropharynx is clear. Uvula midline.  Eyes:     Extraocular Movements: Extraocular movements intact.     Conjunctiva/sclera: Conjunctivae normal.     Pupils: Pupils are equal, round, and reactive to light.  Neck:     Musculoskeletal: Normal range of motion.  Cardiovascular:     Rate and Rhythm: Normal rate and regular rhythm.  Pulmonary:     Effort: Pulmonary effort is normal.     Breath sounds: Normal breath sounds.  Musculoskeletal: Normal range of motion.  Skin:    General: Skin is warm and dry.     Capillary Refill: Capillary refill takes less than 2 seconds.  Neurological:     Mental Status: She is alert and oriented to person, place, and time.     Cranial Nerves: Facial asymmetry present. No cranial nerve deficit.     Sensory: No sensory deficit.     Motor: No weakness.     Coordination: Romberg sign negative. Coordination normal. Finger-Nose-Finger Test and Heel to [facility] Test normal.     Gait: Gait is intact. Gait and tandem walk normal.     Comments: Left sided facial droop including eyebrow and lips. Smile is asymmetric. Speech is clear. Pt alert to person, place, and time. Able to follow 2 step commands.   Psychiatric:        Mood and Affect: Mood normal.        Behavior: Behavior normal.      UC Treatments / Results  Labs (all labs ordered are listed, but only abnormal results are displayed) Labs Reviewed - No data to display  EKG   Radiology No results found.  Procedures Procedures (including critical care time)  Medications Ordered in UC Medications - No data to display  Initial  Impression / Assessment and Plan / UC Course  I have reviewed the triage vital signs and the nursing notes.  Pertinent labs & imaging results that were available during my care of the patient were reviewed by me and considered in my medical decision making (see chart for details).     Hx and exam c/w Bell's Palsy Offered prednisone and valtrex, pt declined at this time as she does not like to take medication unless absolutely necessary.  Discussed signs/symptoms that warrant further evaluation in emergency department. Encouraged f/u with PCP later this week, especially since PCP also manages pt's migraines. Bell's Palsy could be due to prior facial nerve injury/abnormality contributing to her migraines.   Final Clinical Impressions(s) / UC Diagnoses   Final diagnoses:  Bell's palsy     Discharge Instructions      Your history and exam are consistent with Bell's Palsy. Please read additional information in this packet.  Please follow up later this week with your primary care provider for recheck of symptoms.  Occasionally you may need to use over the counter eye drops to help with eye dryness if you cannot fully close your Left eye, especially at night.  Call 911 or have your husband drive you to the closest hospital if symptoms worsening- you develop a severe headache, slurred speech, change in vision, weakness on one  side such as when walking or grasping something, trouble walking, confusion, or other new concerning symptoms develop.     ED Prescriptions    None     Controlled Substance Prescriptions [place] Controlled Substance Registry consulted? Not Applicable   Tyrell Antonio 03/18/19 1733

## 2019-03-18 NOTE — Discharge Instructions (Signed)
°  Your history and exam are consistent with Bell's Palsy. Please read additional information in this packet.  Please follow up later this week with your primary care provider for recheck of symptoms.  Occasionally you may need to use over the counter eye drops to help with eye dryness if you cannot fully close your Left eye, especially at night.  Call 911 or have your husband drive you to the closest hospital if symptoms worsening- you develop a severe headache, slurred speech, change in vision, weakness on one side such as when walking or grasping something, trouble walking, confusion, or other new concerning symptoms develop.

## 2019-03-18 NOTE — ED Triage Notes (Signed)
Yesterday her tongue felt weird, today her face is asymmetrical. Denies pain, discomfort, negative for stroke sx.

## 2019-03-18 NOTE — Telephone Encounter (Signed)
Yesterday tongue numbness and asymmetrical left facial drooping. Sharp pain left temple 1 yr ago and was tx- 3 months ago having increase in pain. H/O bad headache.  Pt not sure when left facial drooping started. Pt was wanting a doctor appt but informed pt that the ED is where she needs to go. Pt advised to call 911 to get to the nearest ED. Pt stated that she can find someone to take her to the ED. Advised pt this could be a neurological event like a CVA and time is of the essence. Advised pt that if she cannot find a ride not to wait and  she needs to call 911 to get to ED ASAP.   Did not got through other triage questions or assessment due to nature of symptoms.  Pt verbalized understanding  Reason for Disposition . [1] Weakness (i.e., paralysis, loss of muscle strength) of the face, arm / hand, or leg / foot on one side of the body AND [2] sudden onset AND [3] present now  Answer Assessment - Initial Assessment Questions 1. SYMPTOM: "What is the main symptom you are concerned about?" (e.g., weakness, numbness)     Left facial drooping, tongue numbness 2. ONSET: "When did this start?" (minutes, hours, days; while sleeping)     Pt stated she is unsure if it was yesterday or today 3. LAST NORMAL: "When was the last time you were normal (no symptoms)?"     Saturday 4. PATTERN "Does this come and go, or has it been constant since it started?"  "Is it present now?"     Drooping is present nw      6. NEUROLOGIC SYMPTOMS: "Have you had any of the following symptoms: headache, dizziness, vision loss, double vision, changes in speech, unsteady on your feet?"     Headache, tongue numbness 7. OTHER SYMPTOMS: "Do you have any other symptoms?"    H/o headaches  Protocols used: NEUROLOGIC DEFICIT-A-AH

## 2019-03-19 ENCOUNTER — Other Ambulatory Visit: Payer: Self-pay | Admitting: Allergy & Immunology

## 2019-03-19 DIAGNOSIS — G43709 Chronic migraine without aura, not intractable, without status migrainosus: Secondary | ICD-10-CM

## 2019-03-19 MED FILL — OMEPRAZOLE 20 MG CPDR: 20 | 90 days supply | Qty: 90 | Fill #3

## 2019-03-19 MED FILL — LEVOTHYROXINE 112 MCG TAB: 112 | 90 days supply | Qty: 90 | Fill #2

## 2019-03-20 MED FILL — AMITRIPTYLINE HCL 25 MG TAB: 25 | 90 days supply | Qty: 180 | Fill #0

## 2019-03-22 ENCOUNTER — Telehealth: Payer: Self-pay | Admitting: Allergy & Immunology

## 2019-03-22 MED ORDER — PREDNISONE 20 MG PO TABS: 40.0000 mg | ORAL_TABLET | Freq: Every day | ORAL | 0 refills | Status: AC

## 2019-03-22 NOTE — Telephone Encounter (Signed)
Prednisone 40mg  daily script sent to pharmacy. Patient notified. Treatment for Bell's palsy.  Salvatore Marvel, MD [facility] [place] [place]

## 2019-04-12 MED FILL — LEVOTHYROXINE 100 MCG TAB: 100 | 30 days supply | Qty: 30 | Fill #3

## 2019-05-02 MED FILL — LEVOCETIRIZINE 5 MG TABLET: 5 | 90 days supply | Qty: 90 | Fill #1

## 2019-05-03 MED FILL — FAMOTIDINE 20 MG TABLET: 20 | 30 days supply | Qty: 30 | Fill #0

## 2019-05-08 ENCOUNTER — Encounter: Payer: No Typology Code available for payment source | Admitting: Family Medicine

## 2019-05-12 MED FILL — LEVOTHYROXINE 100 MCG TAB: 100 | 30 days supply | Qty: 30 | Fill #4

## 2019-05-14 MED FILL — ATORVASTATIN 20 MG TABLET: 20 | 90 days supply | Qty: 90 | Fill #1

## 2019-06-12 MED FILL — LEVOTHYROXINE 100 MCG TAB: 100 | 30 days supply | Qty: 30 | Fill #5

## 2019-06-12 MED FILL — AMITRIPTYLINE HCL 25 MG TAB: 25 | 90 days supply | Qty: 180 | Fill #1

## 2019-06-17 MED FILL — OMEPRAZOLE 20 MG CAP: 20 | 90 days supply | Qty: 90 | Fill #0

## 2019-07-05 ENCOUNTER — Telehealth: Payer: Self-pay | Admitting: Allergy & Immunology

## 2019-07-05 MED ORDER — EPINEPHRINE 0.3 MG/0.3ML IJ SOAJ: 0.3000 mg | INTRAMUSCULAR | 2 refills | Status: AC | PRN

## 2019-07-05 NOTE — Telephone Encounter (Signed)
AuviQ script sent in. Patient notified.   Salvatore Marvel, MD [facility] [place]

## 2019-07-11 MED FILL — LEVOTHYROXINE 100 MCG TAB: 100 | 30 days supply | Qty: 30 | Fill #6

## 2019-07-24 ENCOUNTER — Ambulatory Visit: Payer: No Typology Code available for payment source | Admitting: "Endocrinology

## 2019-07-31 MED FILL — LEVOCETIRIZINE 5 MG TABLET: 5 | 90 days supply | Qty: 90 | Fill #2

## 2019-08-10 ENCOUNTER — Other Ambulatory Visit: Payer: Self-pay | Admitting: "Endocrinology

## 2019-08-12 MED FILL — LEVOTHYROXINE 100 MCG TAB: 100 | 30 days supply | Qty: 30 | Fill #0

## 2019-08-13 MED FILL — ATORVASTATIN 20 MG TABLET: 20 | 90 days supply | Qty: 90 | Fill #2

## 2019-08-16 ENCOUNTER — Other Ambulatory Visit: Payer: Self-pay

## 2019-08-16 ENCOUNTER — Encounter: Payer: Self-pay | Admitting: Family Medicine

## 2019-08-16 ENCOUNTER — Ambulatory Visit (INDEPENDENT_AMBULATORY_CARE_PROVIDER_SITE_OTHER): Payer: No Typology Code available for payment source | Admitting: Family Medicine

## 2019-08-16 VITALS — BP 128/84 | HR 87 | Temp 98.3°F | Resp 17 | Ht 65.0 in | Wt 176.0 lb

## 2019-08-16 DIAGNOSIS — Z23 Encounter for immunization: Secondary | ICD-10-CM | POA: Diagnosis not present

## 2019-08-16 DIAGNOSIS — Z131 Encounter for screening for diabetes mellitus: Secondary | ICD-10-CM | POA: Diagnosis not present

## 2019-08-16 DIAGNOSIS — E782 Mixed hyperlipidemia: Secondary | ICD-10-CM

## 2019-08-16 DIAGNOSIS — Z0000 Encounter for general adult medical examination without abnormal findings: Secondary | ICD-10-CM

## 2019-08-16 DIAGNOSIS — E890 Postprocedural hypothyroidism: Secondary | ICD-10-CM | POA: Diagnosis not present

## 2019-08-16 DIAGNOSIS — I868 Varicose veins of other specified sites: Secondary | ICD-10-CM

## 2019-08-16 DIAGNOSIS — E663 Overweight: Secondary | ICD-10-CM

## 2019-08-16 LAB — T3, FREE: T3, Free: 3.4 pg/mL (ref 2.3–4.2)

## 2019-08-16 LAB — LIPID PANEL
Cholesterol: 249 mg/dL — ABNORMAL HIGH (ref 0–200)
HDL: 61.1 mg/dL (ref >39.00)
LDL Cholesterol: 162 mg/dL — ABNORMAL HIGH (ref 0–99)
NonHDL: 188.05
Total CHOL/HDL Ratio: 4
Triglycerides: 132 mg/dL (ref 0.0–149.0)
VLDL: 26.4 mg/dL (ref 0.0–40.0)

## 2019-08-16 LAB — COMPREHENSIVE METABOLIC PANEL
ALT: 37 U/L — ABNORMAL HIGH (ref 0–35)
AST: 26 U/L (ref 0–37)
Albumin: 4.9 g/dL (ref 3.5–5.2)
Alkaline Phosphatase: 96 U/L (ref 39–117)
BUN: 12 mg/dL (ref 6–23)
CO2: 28 mEq/L (ref 19–32)
Calcium: 9.9 mg/dL (ref 8.4–10.5)
Chloride: 102 mEq/L (ref 96–112)
Creatinine, Ser: 0.86 mg/dL (ref 0.40–1.20)
GFR: 70.51 mL/min (ref >60.00)
Glucose, Bld: 102 mg/dL — ABNORMAL HIGH (ref 70–99)
Potassium: 4.6 mEq/L (ref 3.5–5.1)
Sodium: 140 mEq/L (ref 135–145)
Total Bilirubin: 0.4 mg/dL (ref 0.2–1.2)
Total Protein: 7 g/dL (ref 6.0–8.3)

## 2019-08-16 LAB — HEMOGLOBIN A1C: Hgb A1c MFr Bld: 5.3 % (ref 4.6–6.5)

## 2019-08-16 LAB — CBC
HCT: 44.2 % (ref 36.0–46.0)
Hemoglobin: 15.2 g/dL — ABNORMAL HIGH (ref 12.0–15.0)
MCHC: 34.4 g/dL (ref 30.0–36.0)
MCV: 93.1 fl (ref 78.0–100.0)
Platelets: 242 10*3/uL (ref 150.0–400.0)
RBC: 4.75 Mil/uL (ref 3.87–5.11)
RDW: 12.9 % (ref 11.5–15.5)
WBC: 5.2 10*3/uL (ref 4.0–10.5)

## 2019-08-16 LAB — VITAMIN D 25 HYDROXY (VIT D DEFICIENCY, FRACTURES): VITD: 68.86 ng/mL (ref 30.00–100.00)

## 2019-08-16 LAB — T4, FREE: Free T4: 1.1 ng/dL (ref 0.60–1.60)

## 2019-08-16 LAB — TSH: TSH: 0.62 u[IU]/mL (ref 0.35–4.50)

## 2019-08-16 NOTE — Progress Notes (Signed)
This visit occurred during the SARS-CoV-2 public health emergency.  Safety protocols were in place, including screening questions prior to the visit, additional usage of staff PPE, and extensive cleaning of exam room while observing appropriate contact time as indicated for disinfecting solutions.    [name], female  DOB: 01/04/1971, 48 y.o.   MRN: [id number] Patient Care Team    Relationship Specialty Notifications Start End  Ma Hillock, DO PCP - General Family Medicine  04/12/18   Charisse March, MD Referring Physician Obstetrics and Gynecology  04/12/18   Sarina Ser, MD Referring Physician Endocrinology  04/12/18     Chief Complaint  Patient presents with  . Annual Exam    Fasting. Mammogram 09/2017, Pap smear 2016.     Subjective:  Leslie Valencia is a 48 y.o.  Female  present for CPE. All past medical history, surgical history, allergies, family history, immunizations, medications and social history were updated in the electronic medical record today. All recent labs, ED visits and hospitalizations within the last year were reviewed.  Patient reports a small discoloration and bump above her left lateral eye.  She states she did had a varicose vein in this location in the past.  She denies any visual changes or pain associated with the mass.  She does not think she sustained any trauma to the area.    Hyperlipidemia: Tolerating statin.  Reviewed lipid panel from April which looked greatly improved.  She has gained some weight since struggling with her thyroid over the last 8 months.  Fhx of heart disease in [place]. Former smoker.  She is fasting today.   Health maintenance:  Colonoscopy: no fhx. Routine screen at 50.  Mammogram: has appt in Jan.  Cervical cancer screening: last pap: 2016, has scheduled.  Immunizations: tdap updated today , Influenza UTD 2020 (encouraged yearly).  Shingrix series will be offered at 50. Infectious disease screening:  HIV likely completed with pregnancy. DEXA: routine screen at 60-65 Assistive device: none Oxygen JOA:CZYS Patient has a Dental home. Hospitalizations/ED visits: reviewed  Depression screen [facility] 2/9 08/16/2019 04/12/2018  Decreased Interest 0 0  Down, Depressed, Hopeless 0 0  PHQ - 2 Score 0 0   No flowsheet data found.  Immunization History  Administered Date(s) Administered  . Influenza-Unspecified 06/06/2018  . Tdap 08/16/2019   Past Medical History:  Diagnosis Date  . Allergy   . Asthma    patient denies  . Bronchitis   . Esophagitis   . GERD (gastroesophageal reflux disease)   . History of kidney stones   . Hyperlipidemia   . Hyperthyroidism   . Migraine   . Multinodular goiter   . Seasonal allergies   . Vaginal delivery    x2   Allergies  Allergen Reactions  . Morphine And Related Anaphylaxis  . Mushroom Extract Complex Anaphylaxis  . Norco [Hydrocodone-Acetaminophen] Hives    Stomach pain  . Tramadol     Stomach pain    Past Surgical History:  Procedure Laterality Date  . CHOLECYSTECTOMY  2005  . THYROIDECTOMY  08/22/2018  . THYROIDECTOMY N/A 08/22/2018   Procedure: TOTAL THYROIDECTOMY;  Surgeon: Ralene Ok, MD;  Location: [facility] OR;  Service: General;  Laterality: N/A;   Family History  Problem Relation Age of Onset  . Migraines Mother   . Alcohol abuse Father   . Arthritis Father   . Depression Father   . Early death Father   . Heart disease Maternal Grandmother   .  Hyperlipidemia Maternal Grandmother    Social History   Social History Narrative   Marital status/children/pets: married, 2 children.    Education/employment: BSN   Safety:      -Wears a bicycle helmet riding a bike: Yes     -smoke alarm in the home:Yes     - wears seatbelt: Yes     - Feels safe in their relationships: Yes    Allergies as of 08/16/2019      Reactions   Morphine And Related Anaphylaxis   Mushroom Extract Complex Anaphylaxis   Norco  [hydrocodone-acetaminophen] Hives   Stomach pain   Tramadol    Stomach pain       Medication List       Accurate as of August 16, 2019 11:59 PM. If you have any questions, ask your nurse or doctor.        STOP taking these medications   triamcinolone cream 0.5 % Commonly known as: KENALOG Stopped by: Howard Pouch, DO     TAKE these medications   amitriptyline 25 MG tablet Commonly known as: ELAVIL TAKE 1 TABLET BY MOUTH EVERY NIGHT AT BEDTIME FOR 7 DAY THEN INCREASE TO 2 TABLETS EVERY NIGHT AT BEDTIME   atorvastatin 20 MG tablet Commonly known as: LIPITOR TAKE 1 TABLET BY MOUTH ONCE DAILY   b complex vitamins tablet Take 1 tablet by mouth daily.   EPINEPHrine 0.3 mg/0.3 mL Soaj injection Commonly known as: Auvi-Q Inject 0.3 mLs (0.3 mg total) into the muscle as needed for anaphylaxis.   famotidine 20 MG tablet Commonly known as: PEPCID Take 20 mg by mouth at bedtime.   levothyroxine 100 MCG tablet Commonly known as: SYNTHROID TAKE 1 TABLET BY MOUTH DAILY BEFORE BREAKFAST.   lidocaine 2 % solution Commonly known as: XYLOCAINE Use as directed 15 mLs in the mouth or throat as needed for mouth pain.   magnesium oxide 400 (241.3 Mg) MG tablet Commonly known as: MAG-OX Take 1 tablet (400 mg total) by mouth 2 (two) times daily.   Multi-Vitamin tablet Take by mouth.   omeprazole 20 MG capsule Commonly known as: PRILOSEC Take 20 mg by mouth daily.   VITAMIN C PO Take 1 tablet by mouth daily.       All past medical history, surgical history, allergies, family history, immunizations andmedications were updated in the EMR today and reviewed under the history and medication portions of their EMR.     Recent Results (from the past 2160 hour(s))  CBC     Status: Abnormal   Collection Time: 08/16/19 10:42 AM  Result Value Ref Range   WBC 5.2 4.0 - 10.5 K/uL   RBC 4.75 3.87 - 5.11 Mil/uL   Platelets 242.0 150.0 - 400.0 K/uL   Hemoglobin 15.2 (H) 12.0 - 15.0  g/dL   HCT 44.2 36.0 - 46.0 %   MCV 93.1 78.0 - 100.0 fl   MCHC 34.4 30.0 - 36.0 g/dL   RDW 12.9 11.5 - 15.5 %  Comp Met (CMET)     Status: Abnormal   Collection Time: 08/16/19 10:42 AM  Result Value Ref Range   Sodium 140 135 - 145 mEq/L   Potassium 4.6 3.5 - 5.1 mEq/L   Chloride 102 96 - 112 mEq/L   CO2 28 19 - 32 mEq/L   Glucose, Bld 102 (H) 70 - 99 mg/dL   BUN 12 6 - 23 mg/dL   Creatinine, Ser 0.86 0.40 - 1.20 mg/dL   Total Bilirubin 0.4 0.2 -  1.2 mg/dL   Alkaline Phosphatase 96 39 - 117 U/L   AST 26 0 - 37 U/L   ALT 37 (H) 0 - 35 U/L   Total Protein 7.0 6.0 - 8.3 g/dL   Albumin 4.9 3.5 - 5.2 g/dL   GFR 70.51 >60.00 mL/min   Calcium 9.9 8.4 - 10.5 mg/dL  HgB A1c     Status: None   Collection Time: 08/16/19 10:42 AM  Result Value Ref Range   Hgb A1c MFr Bld 5.3 4.6 - 6.5 %    Comment: Glycemic Control Guidelines for People with Diabetes:Non Diabetic:  <6%Goal of Therapy: <7%Additional Action Suggested:  >8%   Lipid panel     Status: Abnormal   Collection Time: 08/16/19 10:42 AM  Result Value Ref Range   Cholesterol 249 (H) 0 - 200 mg/dL    Comment: ATP III Classification       Desirable:  < 200 mg/dL               Borderline High:  200 - 239 mg/dL          High:  > = 240 mg/dL   Triglycerides 132.0 0.0 - 149.0 mg/dL    Comment: Normal:  <150 mg/dLBorderline High:  150 - 199 mg/dL   HDL 61.10 >39.00 mg/dL   VLDL 26.4 0.0 - 40.0 mg/dL   LDL Cholesterol 162 (H) 0 - 99 mg/dL   Total CHOL/HDL Ratio 4     Comment:                Men          Women1/2 Average Risk     3.4          3.3Average Risk          5.0          4.42X Average Risk          9.6          7.13X Average Risk          15.0          11.0                       NonHDL 188.05     Comment: NOTE:  Non-HDL goal should be 30 mg/dL higher than patient's LDL goal (i.e. LDL goal of < 70 mg/dL, would have non-HDL goal of < 100 mg/dL)  TSH     Status: None   Collection Time: 08/16/19 10:42 AM  Result Value Ref Range    TSH 0.62 0.35 - 4.50 uIU/mL  T4, free     Status: None   Collection Time: 08/16/19 10:42 AM  Result Value Ref Range   Free T4 1.10 0.60 - 1.60 ng/dL    Comment: Specimens from patients who are undergoing biotin therapy and /or ingesting biotin supplements may contain high levels of biotin.  The higher biotin concentration in these specimens interferes with this Free T4 assay.  Specimens that contain high levels  of biotin may cause false high results for this Free T4 assay.  Please interpret results in light of the total clinical presentation of the patient.    Vitamin D (25 hydroxy)     Status: None   Collection Time: 08/16/19 10:42 AM  Result Value Ref Range   VITD 68.86 30.00 - 100.00 ng/mL  T3, free     Status: None   Collection Time: 08/16/19 10:42 AM  Result Value  Ref Range   T3, Free 3.4 2.3 - 4.2 pg/mL    No results found.  ROS: 14 pt review of systems performed and negative (unless mentioned in an HPI)  Objective: BP 128/84 (BP Location: Left Arm, Patient Position: Sitting, Cuff Size: Normal)   Pulse 87   Temp 98.3 F (36.8 C) (Temporal)   Resp 17   Ht _0  (1.651 m)   Wt 176 lb (79.8 kg)   LMP 02/11/2015 (Approximate)   SpO2 97%   BMI 29.29 kg/m  Gen: Afebrile. No acute distress. Nontoxic in appearance, well-developed, well-nourished, very pleasant, overweight Caucasian female. HENT: AT. [place].  Left lateral upper eyelid with small varicosity.  Nontender to touch.  No erythema.  Bilateral TM visualized and normal in appearance, normal external auditory canal. MMM, no oral lesions, adequate dentition. Bilateral nares within normal limits. Throat without erythema, ulcerations or exudates.  No cough on exam, no hoarseness on exam. Eyes:Pupils Equal Round Reactive to light, Extraocular movements intact,  Conjunctiva without redness, discharge or icterus. Neck/lymp/endocrine: Supple, no lymphadenopathy, no thyromegaly-status post thyroidectomy-well healing  CV: RRR no murmur,  no edema, +2/4 P posterior tibialis pulses. Chest: CTAB, no wheeze, rhonchi or crackles.  Normal respiratory effort.  Good air movement. Abd: Soft.  Flat. NTND. BS present.  No masses palpated. No hepatosplenomegaly. No rebound tenderness or guarding. Skin: No rashes, purpura or petechiae. Warm and well-perfused. Skin intact. Neuro/Msk: Normal gait. PERLA. EOMi. Alert. Oriented x3.  Cranial nerves II through XII intact. Muscle strength 5/5 upper/lower extremity. DTRs equal bilaterally. Psych: Normal affect, dress and demeanor. Normal speech. Normal thought content and judgment.  No exam data present  Assessment/plan: Leslie Valencia is a 48 y.o. female present for CPE. Mixed hyperlipidemia/Overweight (BMI 25.0-29.9) Routine exercise. Mediterranean diet or other dietary modifications. Continue Lipitor. - CBC - Comp Met (CMET) - Lipid panel S/P total thyroidectomy/Postsurgical hypothyroidism Under the care of endocrine.  Will forward labs to them. Compliant with levothyroxine 100 mcg daily, also prescribed by endocrine. - TSH - T4, free - Vitamin D (25 hydroxy) - T3, free Diabetes mellitus screening - HgB A1c Need for Tdap vaccination - Tdap vaccine greater than or equal to 7yo IM  Varicose veins of other specified sites Patient has had a varicosity of her left upper eyelid in the past.  Discussed referral to vascular and she will call in if she would like to proceed with referral.  Will place referral without additional appointment.  Encounter for preventive health examination Patient was encouraged to exercise greater than 150 minutes a week. Patient was encouraged to choose a diet filled with fresh fruits and vegetables, and lean meats. AVS provided to patient today for education/recommendation on gender specific health and safety maintenance. Colonoscopy: no fhx. Routine screen at 50.  Mammogram: has appt in Jan.  Cervical cancer screening: last pap: 2016, has scheduled.   Immunizations: tdap updated today , Influenza UTD 2020 (encouraged yearly).  Shingrix series will be offered at 50. Infectious disease screening: HIV likely completed with pregnancy. DEXA: routine screen at 60-65  Return in about 1 year (around 08/15/2020).  Orders Placed This Encounter  Procedures  . Tdap vaccine greater than or equal to 7yo IM  . CBC  . Comp Met (CMET)  . HgB A1c  . Lipid panel  . TSH  . T4, free  . Vitamin D (25 hydroxy)  . T3, free     Electronically signed by: Howard Pouch, DO [place]

## 2019-08-16 NOTE — Patient Instructions (Signed)
Health Maintenance, Female Adopting a healthy lifestyle and getting preventive care are important in promoting health and wellness. Ask your health care provider about:  The right schedule for you to have regular tests and exams.  Things you can do on your own to prevent diseases and keep yourself healthy. What should I know about diet, weight, and exercise? Eat a healthy diet   Eat a diet that includes plenty of vegetables, fruits, low-fat dairy products, and lean protein.  Do not eat a lot of foods that are high in solid fats, added sugars, or sodium. Maintain a healthy weight Body mass index (BMI) is used to identify weight problems. It estimates body fat based on height and weight. Your health care provider can help determine your BMI and help you achieve or maintain a healthy weight. Get regular exercise Get regular exercise. This is one of the most important things you can do for your health. Most adults should:  Exercise for at least 150 minutes each week. The exercise should increase your heart rate and make you sweat (moderate-intensity exercise).  Do strengthening exercises at least twice a week. This is in addition to the moderate-intensity exercise.  Spend less time sitting. Even light physical activity can be beneficial. Watch cholesterol and blood lipids Have your blood tested for lipids and cholesterol at 47 years of age, then have this test every 5 years. Have your cholesterol levels checked more often if:  Your lipid or cholesterol levels are high.  You are older than 47 years of age.  You are at high risk for heart disease. What should I know about cancer screening? Depending on your health history and family history, you may need to have cancer screening at various ages. This may include screening for:  Breast cancer.  Cervical cancer.  Colorectal cancer.  Skin cancer.  Lung cancer. What should I know about heart disease, diabetes, and high blood  pressure? Blood pressure and heart disease  High blood pressure causes heart disease and increases the risk of stroke. This is more likely to develop in people who have high blood pressure readings, are of African descent, or are overweight.  Have your blood pressure checked: ? Every 3-5 years if you are 18-39 years of age. ? Every year if you are 40 years old or older. Diabetes Have regular diabetes screenings. This checks your fasting blood sugar level. Have the screening done:  Once every three years after age 40 if you are at a normal weight and have a low risk for diabetes.  More often and at a younger age if you are overweight or have a high risk for diabetes. What should I know about preventing infection? Hepatitis B If you have a higher risk for hepatitis B, you should be screened for this virus. Talk with your health care provider to find out if you are at risk for hepatitis B infection. Hepatitis C Testing is recommended for:  Everyone born from 1945 through 1965.  Anyone with known risk factors for hepatitis C. Sexually transmitted infections (STIs)  Get screened for STIs, including gonorrhea and chlamydia, if: ? You are sexually active and are younger than 47 years of age. ? You are older than 47 years of age and your health care provider tells you that you are at risk for this type of infection. ? Your sexual activity has changed since you were last screened, and you are at increased risk for chlamydia or gonorrhea. Ask your health care provider if   you are at risk.  Ask your health care provider about whether you are at high risk for HIV. Your health care provider may recommend a prescription medicine to help prevent HIV infection. If you choose to take medicine to prevent HIV, you should first get tested for HIV. You should then be tested every 3 months for as long as you are taking the medicine. Pregnancy  If you are about to stop having your period (premenopausal) and  you may become pregnant, seek counseling before you get pregnant.  Take 400 to 800 micrograms (mcg) of folic acid every day if you become pregnant.  Ask for birth control (contraception) if you want to prevent pregnancy. Osteoporosis and menopause Osteoporosis is a disease in which the bones lose minerals and strength with aging. This can result in bone fractures. If you are 65 years old or older, or if you are at risk for osteoporosis and fractures, ask your health care provider if you should:  Be screened for bone loss.  Take a calcium or vitamin D supplement to lower your risk of fractures.  Be given hormone replacement therapy (HRT) to treat symptoms of menopause. Follow these instructions at home: Lifestyle  Do not use any products that contain nicotine or tobacco, such as cigarettes, e-cigarettes, and chewing tobacco. If you need help quitting, ask your health care provider.  Do not use street drugs.  Do not share needles.  Ask your health care provider for help if you need support or information about quitting drugs. Alcohol use  Do not drink alcohol if: ? Your health care provider tells you not to drink. ? You are pregnant, may be pregnant, or are planning to become pregnant.  If you drink alcohol: ? Limit how much you use to 0-1 drink a day. ? Limit intake if you are breastfeeding.  Be aware of how much alcohol is in your drink. In the [place] one drink equals one 12 oz bottle of beer (355 mL), one 5 oz glass of wine (148 mL), or one 1 oz glass of hard liquor (44 mL). General instructions  Schedule regular health, dental, and eye exams.  Stay current with your vaccines.  Tell your health care provider if: ? You often feel depressed. ? You have ever been abused or do not feel safe at home. Summary  Adopting a healthy lifestyle and getting preventive care are important in promoting health and wellness.  Follow your health care provider's instructions about healthy  diet, exercising, and getting tested or screened for diseases.  Follow your health care provider's instructions on monitoring your cholesterol and blood pressure. This information is not intended to replace advice given to you by your health care provider. Make sure you discuss any questions you have with your health care provider. Document Released: 03/14/2011 Document Revised: 08/22/2018 Document Reviewed: 08/22/2018 Elsevier Patient Education  2020 [employer] Inc[employer]  

## 2019-08-19 ENCOUNTER — Telehealth: Payer: Self-pay | Admitting: Family Medicine

## 2019-08-19 ENCOUNTER — Encounter: Payer: Self-pay | Admitting: Family Medicine

## 2019-08-19 DIAGNOSIS — I868 Varicose veins of other specified sites: Secondary | ICD-10-CM | POA: Insufficient documentation

## 2019-08-19 DIAGNOSIS — E782 Mixed hyperlipidemia: Secondary | ICD-10-CM

## 2019-08-19 MED ORDER — ATORVASTATIN CALCIUM 40 MG PO TABS: 40.0000 mg | ORAL_TABLET | Freq: Every day | ORAL | 3 refills | Status: DC

## 2019-08-19 NOTE — Telephone Encounter (Signed)
Pt has been taking Lipitor daily so she states she would need the increased dose. Pt was given lab results and verbalizes understanding   [facility] outpatient pharmacy and 90 day supply

## 2019-08-19 NOTE — Telephone Encounter (Signed)
Higher dose prescribed Lipitor 40 mg qd. She can finish the bottle she has by taking two of the 20 mg tabs. New bottle is 40 mg per pill.

## 2019-08-19 NOTE — Telephone Encounter (Signed)
Please inform patient the following information: Her thyroid panel is all in normal range.  With normal TSH, T4 and T3. Her vitamin D is normal at 68. Diabetes screen/A1c in normal range at 5.3. One of her liver enzymes, called ALT is very  mildly elevated, although improved from April at 37.   -Her cholesterol panel has worsened since April with a total cholesterol 249 and an LDL of 162.  Her LDL in April was 102.  LDL is not affected by fasting or nonfasting states. Has she been taking the Lipitor? If she has, I will call in an increased dose. If she has not, I will refill her current dose.   I would also recommend a mediterranean diet and regular exercise.  A mediterranean diet is high in fruits, vegetables, whole grains, fish, chicken, nuts, healthy fats (olive oil or canola oil). Low fat dairy. There are many online resources and books on this diet. Limit butter, margarine, red meat and sweets.    Please send copy of labs to Dr. Roney Mans.

## 2019-08-19 NOTE — Addendum Note (Signed)
Addended by: Howard Pouch A on: 08/19/2019 04:53 PM   Modules accepted: Orders

## 2019-08-20 ENCOUNTER — Encounter: Payer: Self-pay | Admitting: Family Medicine

## 2019-08-20 NOTE — Telephone Encounter (Addendum)
Please extend our apologies. In the future, I would suggest she let us know before each  lab collection- as there is no place to store lab preferences or a setting for lab preference (such as with pharmacy etc). Unless we are told, the labs will go to [facility] or [facility] depending upon the time of day.

## 2019-08-20 NOTE — Telephone Encounter (Signed)
Pt was called and given new dose and instructions.   Pt did ask if labs went to [facility], hers went to [facility]. She stated her preferred lab is [facility] and needs all future labs to go there and was upset that she has asked for this to be put in her chart. She was asking about being charged for labs, I did tell her if she received a bill to call back to discuss that I could not see if she would be getting a bill for these labs.

## 2019-08-21 ENCOUNTER — Telehealth (INDEPENDENT_AMBULATORY_CARE_PROVIDER_SITE_OTHER): Payer: No Typology Code available for payment source | Admitting: "Endocrinology

## 2019-08-21 ENCOUNTER — Encounter: Payer: Self-pay | Admitting: "Endocrinology

## 2019-08-21 DIAGNOSIS — E782 Mixed hyperlipidemia: Secondary | ICD-10-CM | POA: Diagnosis not present

## 2019-08-21 DIAGNOSIS — E890 Postprocedural hypothyroidism: Secondary | ICD-10-CM

## 2019-08-21 MED ORDER — LEVOTHYROXINE SODIUM 100 MCG PO TABS: ORAL_TABLET | ORAL | 2 refills | Status: DC

## 2019-08-21 NOTE — Progress Notes (Signed)
08/21/2019, 5:37 PM                                 Endocrinology Telehealth Visit Follow up Note -During COVID -19 Pandemic  I connected with Leslie Valencia on 08/21/2019   by WebEx  and verified that I am speaking with the correct person using two identifiers. Leslie Valencia, October 15, 1970. she has verbally consented to this visit. All issues noted in this document were discussed and addressed. The format was not optimal for physical exam.   Subjective:    [name], female    DOB: [birth year], PCP Ma Hillock, DO   Past Medical History:  Diagnosis Date  . Allergy   . Asthma    patient denies  . Bronchitis   . Esophagitis   . GERD (gastroesophageal reflux disease)   . History of kidney stones   . Hyperlipidemia   . Hyperthyroidism   . Migraine   . Multinodular goiter   . Seasonal allergies   . Vaginal delivery    x2   Past Surgical History:  Procedure Laterality Date  . CHOLECYSTECTOMY  2005  . THYROIDECTOMY  08/22/2018  . THYROIDECTOMY N/A 08/22/2018   Procedure: TOTAL THYROIDECTOMY;  Surgeon: Ralene Ok, MD;  Location: [facility];  Service: General;  Laterality: N/A;   Social History   Socioeconomic History  . Marital status: Married    Spouse name: Not on file  . Number of children: Not on file  . Years of education: Not on file  . Highest education level: Not on file  Occupational History  . Not on file  Social Needs  . Financial resource strain: Not on file  . Food insecurity    Worry: Not on file    Inability: Not on file  . Transportation needs    Medical: Not on file    Non-medical: Not on file  Tobacco Use  . Smoking status: Former Smoker    Quit date: 2004    Years since quitting: 16.9  . Smokeless tobacco: Never Used  Substance and Sexual Activity  . Alcohol use: Yes    Comment: occasional  . Drug use: No  . Sexual activity: Yes    Partners: Male    Comment:  married  Lifestyle  . Physical activity    Days per week: Not on file    Minutes per session: Not on file  . Stress: Not on file  Relationships  . Social Herbalist on phone: Not on file    Gets together: Not on file    Attends religious service: Not on file    Active member of club or organization: Not on file    Attends meetings of clubs or organizations: Not on file    Relationship status: Not on file  Other Topics Concern  . Not on file  Social History Narrative   Marital status/children/pets: married, 2 children.    Education/employment: BSN   Safety:      -Wears a bicycle helmet riding a bike: Yes     -smoke alarm in the home:Yes     -  wears seatbelt: Yes     - Feels safe in their relationships: Yes   Outpatient Encounter Medications as of 08/21/2019  Medication Sig  . amitriptyline (ELAVIL) 25 MG tablet TAKE 1 TABLET BY MOUTH EVERY NIGHT AT BEDTIME FOR 7 DAY THEN INCREASE TO 2 TABLETS EVERY NIGHT AT BEDTIME  . Ascorbic Acid (VITAMIN C PO) Take 1 tablet by mouth daily.  Marland Kitchen atorvastatin (LIPITOR) 40 MG tablet Take 1 tablet (40 mg total) by mouth daily.  Marland Kitchen b complex vitamins tablet Take 1 tablet by mouth daily.  Marland Kitchen EPINEPHrine (AUVI-Q) 0.3 mg/0.3 mL IJ SOAJ injection Inject 0.3 mLs (0.3 mg total) into the muscle as needed for anaphylaxis.  . famotidine (PEPCID) 20 MG tablet Take 20 mg by mouth at bedtime.  Marland Kitchen levothyroxine (SYNTHROID) 100 MCG tablet TAKE 1 TABLET BY MOUTH DAILY BEFORE BREAKFAST.  Marland Kitchen lidocaine (XYLOCAINE) 2 % solution Use as directed 15 mLs in the mouth or throat as needed for mouth pain.  . magnesium oxide (MAG-OX) 400 (241.3 Mg) MG tablet Take 1 tablet (400 mg total) by mouth 2 (two) times daily.  . Multiple Vitamin (MULTI-VITAMIN) tablet Take by mouth.  Marland Kitchen omeprazole (PRILOSEC) 20 MG capsule Take 20 mg by mouth daily.  . [DISCONTINUED] levothyroxine (SYNTHROID) 100 MCG tablet TAKE 1 TABLET BY MOUTH DAILY BEFORE BREAKFAST.   No facility-administered  encounter medications on file as of 08/21/2019.    ALLERGIES: Allergies  Allergen Reactions  . Morphine And Related Anaphylaxis  . Mushroom Extract Complex Anaphylaxis  . Norco [Hydrocodone-Acetaminophen] Hives    Stomach pain  . Tramadol     Stomach pain     VACCINATION STATUS: Immunization History  Administered Date(s) Administered  . Influenza-Unspecified 06/06/2018  . Tdap 08/16/2019    HPI Leslie Valencia is 48 y.o. female who is engaged in telehealth via WebEx to follow-up for her postsurgical hypothyroidism.    PCP: Raoul Pitch, Renee A, DO.   Her history includes longstanding toxic multinodular goiter for which she underwent total thyroidectomy on December 07/2018.  She has recovered from her surgery very well.  She was initiated on levothyroxine subsequent to her surgery, currently on 100 mcg p.o. nightly.  Her previsit labs show  Appropriate replacement.  Clinically she has no new complaints today. She denies palpitations, tremors, heat intolerance.   -Her voice is getting better.  Her surgical sample was significant for benign nodular goiter.  She has no family history of thyroid malignancy.   Review of Systems  Limited as above.  Objective:    LMP 02/11/2015 (Approximate)   Wt Readings from Last 3 Encounters:  08/16/19 176 lb (79.8 kg)  03/18/19 180 lb (81.6 kg)  09/26/18 173 lb (78.5 kg)    Physical Exam  CMP     Component Value Date/Time   NA 140 08/16/2019 1042   NA 144 01/09/2019 1007   K 4.6 08/16/2019 1042   CL 102 08/16/2019 1042   CO2 28 08/16/2019 1042   GLUCOSE 102 (H) 08/16/2019 1042   BUN 12 08/16/2019 1042   BUN 9 01/09/2019 1007   CREATININE 0.86 08/16/2019 1042   CALCIUM 9.9 08/16/2019 1042   PROT 7.0 08/16/2019 1042   PROT 7.0 01/09/2019 1007   ALBUMIN 4.9 08/16/2019 1042   ALBUMIN 4.8 01/09/2019 1007   AST 26 08/16/2019 1042   ALT 37 (H) 08/16/2019 1042   ALKPHOS 96 08/16/2019 1042   BILITOT 0.4 08/16/2019 1042   BILITOT 0.4  01/09/2019 1007   GFRNONAA 64  01/09/2019 1007   GFRAA 74 01/09/2019 1007   Lipid Panel     Component Value Date/Time   CHOL 249 (H) 08/16/2019 1042   CHOL 194 01/09/2019 1006   TRIG 132.0 08/16/2019 1042   HDL 61.10 08/16/2019 1042   HDL 63 01/09/2019 1006   CHOLHDL 4 08/16/2019 1042   VLDL 26.4 08/16/2019 1042   LDLCALC 162 (H) 08/16/2019 1042   LDLCALC 102 (H) 01/09/2019 1006     Lab Results  Component Value Date   TSH 0.62 08/16/2019   TSH 0.134 (L) 01/09/2019   TSH 1.380 09/17/2018   TSH 1.30 12/05/2017   FREET4 1.10 08/16/2019   FREET4 1.45 01/09/2019     Assessment & Plan:   1. Postsurgical hypothyroidism 2.  Toxic multinodular goiter-resolved   she has postsurgical hypothyroidism.  Total thyroidectomy performed for chronic toxic multinodular goiter.  Surgical sample negative for malignancy. -Her previsit thyroid function tests are consistent with appropriate replacement.  She is advised to continue  levothyroxine  100 mcg daily before breakfast.   - We discussed about the correct intake of her thyroid hormone, on empty stomach at fasting, with water, separated by at least 30 minutes from breakfast and other medications,  and separated by more than 4 hours from calcium, iron, multivitamins, acid reflux medications (PPIs). -Patient is made aware of the fact that thyroid hormone replacement is needed for life, dose to be adjusted by periodic monitoring of thyroid function tests.  -She has premature menopause, at age 61.  She will be considered for a bone density on her subsequent visits. -She is advised to continue on her atorvastatin 40 mg p.o. nightly for mixed hyperlipidemia.  - I advised her  to maintain close follow up with Raoul Pitch, Renee A, DO for primary care needs.  Time for this visit: 15 minutes. Leslie Valencia  participated in the discussions, expressed understanding, and voiced agreement with the above plans.  All questions were answered to her  satisfaction. she is encouraged to contact clinic should she have any questions or concerns prior to her return visit.  Follow up plan: Return in about 6 months (around 02/19/2020) for Follow up with Pre-visit Labs.   Glade Lloyd, MD [facility] [address] [place], [place] [address] Phone: [phone]  Fax: [phone]     08/21/2019, 5:37 PM  This note was partially dictated with voice recognition software. Similar sounding words can be transcribed inadequately or may not  be corrected upon review.

## 2019-09-05 MED FILL — LEVOTHYROXINE 100 MCG TAB: 100 | 30 days supply | Qty: 30 | Fill #1

## 2019-09-09 MED FILL — OMEPRAZOLE 20 MG CAP: 20 | 90 days supply | Qty: 90 | Fill #1

## 2019-09-10 MED FILL — AMITRIPTYLINE HCL 25 MG TAB: 25 | 90 days supply | Qty: 180 | Fill #2

## 2019-09-17 ENCOUNTER — Other Ambulatory Visit: Payer: Self-pay | Admitting: Family Medicine

## 2019-09-17 DIAGNOSIS — Z1231 Encounter for screening mammogram for malignant neoplasm of breast: Secondary | ICD-10-CM

## 2019-09-19 ENCOUNTER — Other Ambulatory Visit: Payer: Self-pay

## 2019-09-19 ENCOUNTER — Ambulatory Visit (INDEPENDENT_AMBULATORY_CARE_PROVIDER_SITE_OTHER): Payer: No Typology Code available for payment source

## 2019-09-19 DIAGNOSIS — Z1231 Encounter for screening mammogram for malignant neoplasm of breast: Secondary | ICD-10-CM | POA: Diagnosis not present

## 2019-10-05 MED FILL — LEVOTHYROXINE 100 MCG TAB: 100 | 30 days supply | Qty: 30 | Fill #2

## 2019-10-14 ENCOUNTER — Telehealth: Payer: Self-pay | Admitting: Allergy & Immunology

## 2019-10-14 MED ORDER — AMOXICILLIN-POT CLAVULANATE 875-125 MG PO TABS: 1.0000 | ORAL_TABLET | Freq: Two times a day (BID) | ORAL | 0 refills | Status: AC

## 2019-10-14 NOTE — Telephone Encounter (Signed)
Patient reported a cat bite on her right hand.  There was piercing of the skin and the area is already erythematous and indurated.  It is slightly tender without any discharge.  I will go ahead and send in a 1 week course of Augmentin to cover for any anaerobic bacteria.  The cat was her own and all of the cats vaccines are up-to-date.  Salvatore Marvel, MD [facility] [place]

## 2019-10-29 MED FILL — LEVOCETIRIZINE 5 MG TABLET: 5 | 90 days supply | Qty: 90 | Fill #3

## 2019-11-05 MED FILL — LEVOTHYROXINE 100 MCG TAB: 100 | 30 days supply | Qty: 30 | Fill #3

## 2019-11-11 MED FILL — ATORVASTATIN 40 MG TABLET: 40 | 90 days supply | Qty: 90 | Fill #0

## 2019-11-12 ENCOUNTER — Other Ambulatory Visit: Payer: Self-pay | Admitting: Family Medicine

## 2019-11-12 ENCOUNTER — Other Ambulatory Visit: Payer: Self-pay | Admitting: Allergy & Immunology

## 2019-11-12 DIAGNOSIS — E782 Mixed hyperlipidemia: Secondary | ICD-10-CM

## 2019-11-13 ENCOUNTER — Other Ambulatory Visit: Payer: Self-pay | Admitting: *Deleted

## 2019-12-04 MED FILL — LEVOTHYROXINE 100 MCG TAB: 100 | 30 days supply | Qty: 30 | Fill #4

## 2019-12-08 MED FILL — OMEPRAZOLE 20 MG CAP: 20 | 90 days supply | Qty: 90 | Fill #2

## 2019-12-09 ENCOUNTER — Other Ambulatory Visit: Payer: Self-pay | Admitting: Allergy & Immunology

## 2019-12-09 DIAGNOSIS — G43709 Chronic migraine without aura, not intractable, without status migrainosus: Secondary | ICD-10-CM

## 2019-12-09 MED FILL — AMITRIPTYLINE HCL 25 MG TAB: 25 | 90 days supply | Qty: 180 | Fill #0

## 2019-12-09 NOTE — Telephone Encounter (Signed)
Please advise on refilling this medication. 

## 2020-01-03 MED FILL — LEVOTHYROXINE 100 MCG TAB: 100 | 30 days supply | Qty: 30 | Fill #5

## 2020-01-27 ENCOUNTER — Other Ambulatory Visit: Payer: Self-pay | Admitting: Allergy & Immunology

## 2020-01-27 MED FILL — LEVOCETIRIZINE 5 MG TABLET: 5 | 90 days supply | Qty: 90 | Fill #0

## 2020-02-01 MED FILL — LEVOTHYROXINE 100 MCG TAB: 100 | 30 days supply | Qty: 30 | Fill #5

## 2020-02-03 MED FILL — ATORVASTATIN CALCIUM 40 MG: 40 | 90 days supply | Qty: 90 | Fill #1

## 2020-02-05 ENCOUNTER — Telehealth: Payer: Self-pay | Admitting: Allergy & Immunology

## 2020-02-05 MED ORDER — LIDOCAINE VISCOUS HCL 2 % MT SOLN: 15.0000 mL | OROMUCOSAL | 2 refills | Status: DC | PRN

## 2020-02-05 MED FILL — LIDOCAINE 2% VISCOUS SOLN: 2 | 2 days supply | Qty: 100 | Fill #0

## 2020-02-05 NOTE — Telephone Encounter (Signed)
Refill sent.   Salvatore Marvel, MD [facility] [place] [facility] [place]

## 2020-02-18 MED ORDER — LEVOCETIRIZINE DIHYDROCHLORIDE 5 MG PO TABS: 5.0000 mg | ORAL_TABLET | Freq: Every evening | ORAL | 3 refills | Status: DC

## 2020-02-18 NOTE — Addendum Note (Signed)
Addended by: Valentina Shaggy on: 02/18/2020 03:44 PM   Modules accepted: Orders

## 2020-02-20 ENCOUNTER — Ambulatory Visit: Payer: No Typology Code available for payment source | Admitting: "Endocrinology

## 2020-03-02 MED FILL — AMITRIPTYLINE HCL 25 MG TAB: 25 | 90 days supply | Qty: 180 | Fill #1

## 2020-03-03 MED FILL — LEVOTHYROXINE 100 MCG TAB: 100 | 30 days supply | Qty: 30 | Fill #6

## 2020-03-07 MED FILL — OMEPRAZOLE 20 MG CAP: 20 | 90 days supply | Qty: 90 | Fill #3

## 2020-03-26 ENCOUNTER — Other Ambulatory Visit: Payer: Self-pay

## 2020-03-26 DIAGNOSIS — E890 Postprocedural hypothyroidism: Secondary | ICD-10-CM

## 2020-03-27 LAB — T4, FREE: Free T4: 1.17 ng/dL (ref 0.82–1.77)

## 2020-03-27 LAB — TSH: TSH: 2.85 u[IU]/mL (ref 0.450–4.500)

## 2020-03-31 ENCOUNTER — Ambulatory Visit: Payer: No Typology Code available for payment source | Admitting: "Endocrinology

## 2020-04-01 ENCOUNTER — Ambulatory Visit (INDEPENDENT_AMBULATORY_CARE_PROVIDER_SITE_OTHER): Payer: No Typology Code available for payment source | Admitting: "Endocrinology

## 2020-04-01 ENCOUNTER — Encounter: Payer: Self-pay | Admitting: "Endocrinology

## 2020-04-01 ENCOUNTER — Other Ambulatory Visit: Payer: Self-pay

## 2020-04-01 VITALS — BP 131/86 | HR 73 | Ht 65.0 in | Wt 181.4 lb

## 2020-04-01 DIAGNOSIS — E782 Mixed hyperlipidemia: Secondary | ICD-10-CM | POA: Diagnosis not present

## 2020-04-01 DIAGNOSIS — E890 Postprocedural hypothyroidism: Secondary | ICD-10-CM | POA: Diagnosis not present

## 2020-04-01 MED ORDER — LEVOTHYROXINE SODIUM 100 MCG PO TABS: ORAL_TABLET | ORAL | 1 refills | Status: DC

## 2020-04-01 MED FILL — LEVOTHYROXINE 100 MCG TAB: 100 | 90 days supply | Qty: 90 | Fill #0

## 2020-04-01 NOTE — Progress Notes (Signed)
04/01/2020, 1:03 PM       Endocrinology follow-up note   Subjective:    [name], female    DOB: 1971/07/23, PCP Ma Hillock, DO   Past Medical History:  Diagnosis Date  . Allergy   . Asthma    patient denies  . Bronchitis   . Esophagitis   . GERD (gastroesophageal reflux disease)   . History of kidney stones   . Hyperlipidemia   . Hyperthyroidism   . Migraine   . Multinodular goiter   . Seasonal allergies   . Vaginal delivery    x2   Past Surgical History:  Procedure Laterality Date  . CHOLECYSTECTOMY  2005  . THYROIDECTOMY  08/22/2018  . THYROIDECTOMY N/A 08/22/2018   Procedure: TOTAL THYROIDECTOMY;  Surgeon: Ralene Ok, MD;  Location: [facility];  Service: General;  Laterality: N/A;   Social History   Socioeconomic History  . Marital status: Married    Spouse name: Not on file  . Number of children: Not on file  . Years of education: Not on file  . Highest education level: Not on file  Occupational History  . Not on file  Tobacco Use  . Smoking status: Former Smoker    Quit date: 2004    Years since quitting: 17.5  . Smokeless tobacco: Never Used  Vaping Use  . Vaping Use: Never used  Substance and Sexual Activity  . Alcohol use: Yes    Comment: occasional  . Drug use: No  . Sexual activity: Yes    Partners: Male    Comment: married  Other Topics Concern  . Not on file  Social History Narrative   Marital status/children/pets: married, 2 children.    Education/employment: BSN   Safety:      -Wears a bicycle helmet riding a bike: Yes     -smoke alarm in the home:Yes     - wears seatbelt: Yes     - Feels safe in their relationships: Yes   Social Determinants of Health   Financial Resource Strain:   . Difficulty of Paying Living Expenses:   Food Insecurity:   . Worried About Charity fundraiser in the Last Year:   . Arboriculturist in the Last Year:    Transportation Needs:   . Film/video editor (Medical):   Marland Kitchen Lack of Transportation (Non-Medical):   Physical Activity:   . Days of Exercise per Week:   . Minutes of Exercise per Session:   Stress:   . Feeling of Stress :   Social Connections:   . Frequency of Communication with Friends and Family:   . Frequency of Social Gatherings with Friends and Family:   . Attends Religious Services:   . Active Member of Clubs or Organizations:   . Attends Archivist Meetings:   Marland Kitchen Marital Status:    Outpatient Encounter Medications as of 04/01/2020  Medication Sig  . amitriptyline (ELAVIL) 25 MG tablet TAKE 1 TABLET BY MOUTH EVERY NIGHT AT BEDTIME FOR 7 DAY THEN INCREASE TO 2 TABLETS EVERY NIGHT AT BEDTIME  . Ascorbic Acid (VITAMIN C PO) Take 1 tablet by mouth daily.  Marland Kitchen atorvastatin (LIPITOR) 40 MG  tablet Take 1 tablet (40 mg total) by mouth daily.  Marland Kitchen b complex vitamins tablet Take 1 tablet by mouth daily.  Marland Kitchen EPINEPHrine (AUVI-Q) 0.3 mg/0.3 mL IJ SOAJ injection Inject 0.3 mLs (0.3 mg total) into the muscle as needed for anaphylaxis.  . famotidine (PEPCID) 20 MG tablet Take 20 mg by mouth at bedtime.  Marland Kitchen levocetirizine (XYZAL) 5 MG tablet Take 1 tablet (5 mg total) by mouth every evening.  Marland Kitchen levothyroxine (SYNTHROID) 100 MCG tablet TAKE 1 TABLET BY MOUTH DAILY BEFORE BREAKFAST.  Marland Kitchen lidocaine (XYLOCAINE) 2 % solution Use as directed 15 mLs in the mouth or throat as needed for mouth pain.  . magnesium oxide (MAG-OX) 400 (241.3 Mg) MG tablet Take 1 tablet (400 mg total) by mouth 2 (two) times daily.  . Multiple Vitamin (MULTI-VITAMIN) tablet Take by mouth.  Marland Kitchen omeprazole (PRILOSEC) 20 MG capsule Take 20 mg by mouth daily.  . [DISCONTINUED] levothyroxine (SYNTHROID) 100 MCG tablet TAKE 1 TABLET BY MOUTH DAILY BEFORE BREAKFAST.   No facility-administered encounter medications on file as of 04/01/2020.   ALLERGIES: Allergies  Allergen Reactions  . Morphine And Related Anaphylaxis  .  Mushroom Extract Complex Anaphylaxis  . Norco [Hydrocodone-Acetaminophen] Hives    Stomach pain  . Tramadol     Stomach pain     VACCINATION STATUS: Immunization History  Administered Date(s) Administered  . Influenza-Unspecified 06/06/2018  . Tdap 08/16/2019    HPI Leslie Valencia is 49 y.o. female who is being seen in follow-up for postsurgical hypothyroidism.  She was has hyperlipidemia.   PCP: Raoul Pitch, Renee A, DO.   Her history includes longstanding toxic multinodular goiter for which she underwent total thyroidectomy on December 07/2018.  She has recovered from her surgery very well.  She is currently on levothyroxine 100 mcg p.o. daily before breakfast.  She continues to tolerate this medication very well.  She has no new complaints today.  She has more steady energy level, and steady body weight.    She denies palpitations, tremors, heat intolerance.   -Her voice is getting better.  Her surgical sample was significant for benign nodular goiter.  She has no family history of thyroid malignancy.   Review of Systems  Limited as above.  Objective:    BP 131/86   Pulse 73   Ht 5\' 5"  (1.651 m)   Wt 181 lb 6.4 oz (82.3 kg)   LMP 02/11/2015 (Approximate)   BMI 30.19 kg/m   Wt Readings from Last 3 Encounters:  04/01/20 181 lb 6.4 oz (82.3 kg)  08/16/19 176 lb (79.8 kg)  03/18/19 180 lb (81.6 kg)    Physical Exam  CMP     Component Value Date/Time   NA 140 08/16/2019 1042   NA 144 01/09/2019 1007   K 4.6 08/16/2019 1042   CL 102 08/16/2019 1042   CO2 28 08/16/2019 1042   GLUCOSE 102 (H) 08/16/2019 1042   BUN 12 08/16/2019 1042   BUN 9 01/09/2019 1007   CREATININE 0.86 08/16/2019 1042   CALCIUM 9.9 08/16/2019 1042   PROT 7.0 08/16/2019 1042   PROT 7.0 01/09/2019 1007   ALBUMIN 4.9 08/16/2019 1042   ALBUMIN 4.8 01/09/2019 1007   AST 26 08/16/2019 1042   ALT 37 (H) 08/16/2019 1042   ALKPHOS 96 08/16/2019 1042   BILITOT 0.4 08/16/2019 1042   BILITOT 0.4  01/09/2019 1007   GFRNONAA 64 01/09/2019 1007   GFRAA 74 01/09/2019 1007   Lipid Panel  Component Value Date/Time   CHOL 249 (H) 08/16/2019 1042   CHOL 194 01/09/2019 1006   TRIG 132.0 08/16/2019 1042   HDL 61.10 08/16/2019 1042   HDL 63 01/09/2019 1006   CHOLHDL 4 08/16/2019 1042   VLDL 26.4 08/16/2019 1042   LDLCALC 162 (H) 08/16/2019 1042   LDLCALC 102 (H) 01/09/2019 1006     Lab Results  Component Value Date   TSH 2.850 03/26/2020   TSH 0.62 08/16/2019   TSH 0.134 (L) 01/09/2019   TSH 1.380 09/17/2018   TSH 1.30 12/05/2017   FREET4 1.17 03/26/2020   FREET4 1.10 08/16/2019   FREET4 1.45 01/09/2019     Assessment & Plan:   1. Postsurgical hypothyroidism 2.  Toxic multinodular goiter-resolved   she has postsurgical hypothyroidism.  Total thyroidectomy performed for chronic toxic multinodular goiter.  Surgical sample negative for malignancy. -Her previsit thyroid function tests are consistent with appropriate replacement.  She is advised to continue  levothyroxine  100 mcg daily before breakfast.  - We discussed about the correct intake of her thyroid hormone, on empty stomach at fasting, with water, separated by at least 30 minutes from breakfast and other medications,  and separated by more than 4 hours from calcium, iron, multivitamins, acid reflux medications (PPIs). -Patient is made aware of the fact that thyroid hormone replacement is needed for life, dose to be adjusted by periodic monitoring of thyroid function tests.   -She has premature menopause, at age 76.  She will be considered for a bone density on her subsequent visits. -Regarding her hyperlipidemia, she is advised to continue on her atorvastatin 40 mg p.o. nightly for mixed hyperlipidemia.  - I advised her  to maintain close follow up with Raoul Pitch, Renee A, DO for primary care needs.    - Time spent on this patient care encounter:  20 minutes of which 50% was spent in  counseling and the rest reviewing   her current and  previous labs / studies and medications  doses and developing a plan for long term care. Benard Rink  participated in the discussions, expressed understanding, and voiced agreement with the above plans.  All questions were answered to her satisfaction. she is encouraged to contact clinic should she have any questions or concerns prior to her return visit.   Follow up plan: Return in about 6 months (around 10/02/2020) for F/U with Pre-visit Labs.   Glade Lloyd, MD [facility] [address] [place],  [address] Phone: [phone]  Fax: [phone]     04/01/2020, 1:03 PM  This note was partially dictated with voice recognition software. Similar sounding words can be transcribed inadequately or may not  be corrected upon review.

## 2020-04-21 MED FILL — LEVOCETIRIZINE 5 MG TABLET: 5 | 90 days supply | Qty: 90 | Fill #1

## 2020-05-03 MED FILL — ATORVASTATIN CALCIUM 40 MG: 40 | 90 days supply | Qty: 90 | Fill #2

## 2020-05-31 MED FILL — AMITRIPTYLINE HCL 25 MG TAB: 25 | 90 days supply | Qty: 180 | Fill #2

## 2020-06-24 MED FILL — LEVOTHYROXINE 100 MCG TAB: 100 | 90 days supply | Qty: 90 | Fill #1

## 2020-06-30 ENCOUNTER — Other Ambulatory Visit: Payer: Self-pay | Admitting: Allergy & Immunology

## 2020-06-30 ENCOUNTER — Telehealth: Payer: Self-pay | Admitting: Allergy & Immunology

## 2020-06-30 MED ORDER — LEVOCETIRIZINE DIHYDROCHLORIDE 5 MG PO TABS: 5.0000 mg | ORAL_TABLET | Freq: Every evening | ORAL | 3 refills | Status: DC

## 2020-06-30 MED ORDER — OMEPRAZOLE 20 MG PO CPDR: 20.0000 mg | DELAYED_RELEASE_CAPSULE | Freq: Every day | ORAL | 3 refills | Status: DC

## 2020-06-30 MED FILL — OMEPRAZOLE 20 MG CAP: 20 | 90 days supply | Qty: 90 | Fill #0

## 2020-06-30 NOTE — Telephone Encounter (Signed)
Patient called requesting refills of Prilosec and Xyzal.  Salvatore Marvel, MD [facility] and [place] [facility] [place]

## 2020-07-06 MED FILL — LEVOCETIRIZINE 5 MG TABLET: 5 | 90 days supply | Qty: 90 | Fill #0

## 2020-07-13 ENCOUNTER — Other Ambulatory Visit: Payer: Self-pay | Admitting: *Deleted

## 2020-07-13 DIAGNOSIS — Z20822 Contact with and (suspected) exposure to covid-19: Secondary | ICD-10-CM

## 2020-07-14 LAB — SARS-COV-2, NAA 2 DAY TAT

## 2020-07-14 LAB — NOVEL CORONAVIRUS, NAA: SARS-CoV-2, NAA: NOT DETECTED

## 2020-07-31 ENCOUNTER — Other Ambulatory Visit: Payer: Self-pay

## 2020-07-31 ENCOUNTER — Ambulatory Visit (INDEPENDENT_AMBULATORY_CARE_PROVIDER_SITE_OTHER): Payer: No Typology Code available for payment source

## 2020-07-31 DIAGNOSIS — Z23 Encounter for immunization: Secondary | ICD-10-CM

## 2020-08-01 MED FILL — ATORVASTATIN 40 MG TABLET: 40 | 90 days supply | Qty: 90 | Fill #3

## 2020-08-03 NOTE — Progress Notes (Signed)
   Covid-19 Vaccination Clinic  Name:  KAYTLIN BURKLOW    MRN: [id number] DOB: Sep 04, 1971  08/03/2020  Ms. Hanel was observed post Covid-19 immunization for 15 minutes without incident. She was provided with Vaccine Information Sheet and instruction to access the V-Safe system.   Ms. Cornacchia was instructed to call 911 with any severe reactions post vaccine: Marland Kitchen Difficulty breathing  . Swelling of face and throat  . A fast heartbeat  . A bad rash all over body  . Dizziness and weakness   Immunizations Administered    No immunizations on file.

## 2020-08-29 ENCOUNTER — Other Ambulatory Visit: Payer: Self-pay | Admitting: Allergy & Immunology

## 2020-08-29 DIAGNOSIS — G43709 Chronic migraine without aura, not intractable, without status migrainosus: Secondary | ICD-10-CM

## 2020-08-31 ENCOUNTER — Other Ambulatory Visit: Payer: Self-pay | Admitting: Allergy & Immunology

## 2020-08-31 MED FILL — AMITRIPTYLINE HCL 25 MG TAB: 25 | 90 days supply | Qty: 180 | Fill #0

## 2020-09-01 ENCOUNTER — Other Ambulatory Visit: Payer: Self-pay

## 2020-09-01 ENCOUNTER — Telehealth (INDEPENDENT_AMBULATORY_CARE_PROVIDER_SITE_OTHER): Payer: No Typology Code available for payment source | Admitting: Allergy & Immunology

## 2020-09-01 ENCOUNTER — Other Ambulatory Visit: Payer: Self-pay | Admitting: Allergy & Immunology

## 2020-09-01 DIAGNOSIS — G43709 Chronic migraine without aura, not intractable, without status migrainosus: Secondary | ICD-10-CM

## 2020-09-01 DIAGNOSIS — Z1152 Encounter for screening for COVID-19: Secondary | ICD-10-CM | POA: Diagnosis not present

## 2020-09-01 DIAGNOSIS — J029 Acute pharyngitis, unspecified: Secondary | ICD-10-CM

## 2020-09-01 MED ORDER — AMITRIPTYLINE HCL 25 MG PO TABS: ORAL_TABLET | ORAL | 2 refills | Status: DC

## 2020-09-01 NOTE — Telephone Encounter (Signed)
Patient reported postnasal drip and sore throat. Because she had been out and about, I ordered a rapid COVID swab which was negative.  Salvatore Marvel, MD [facility] [place] [facility] [place]

## 2020-09-02 ENCOUNTER — Other Ambulatory Visit: Payer: Self-pay | Admitting: Allergy & Immunology

## 2020-09-02 ENCOUNTER — Telehealth: Payer: Self-pay | Admitting: Allergy & Immunology

## 2020-09-02 MED ORDER — AMOXICILLIN-POT CLAVULANATE 875-125 MG PO TABS: 1.0000 | ORAL_TABLET | Freq: Two times a day (BID) | ORAL | 0 refills | Status: DC

## 2020-09-02 MED FILL — AMOX-CLAV 875-125 MG TABLET: 875-125 | 10 days supply | Qty: 20 | Fill #0

## 2020-09-02 NOTE — Telephone Encounter (Signed)
Patient called with sinusitis symptoms. I sent in Augmentin twice daily for ten days.   Salvatore Marvel, MD Allergy and [place] [facility] [place]

## 2020-09-10 ENCOUNTER — Telehealth (INDEPENDENT_AMBULATORY_CARE_PROVIDER_SITE_OTHER): Payer: No Typology Code available for payment source | Admitting: Allergy & Immunology

## 2020-09-10 DIAGNOSIS — Z1152 Encounter for screening for COVID-19: Secondary | ICD-10-CM | POA: Diagnosis not present

## 2020-09-10 NOTE — Telephone Encounter (Signed)
Late entry: Patient is going out of town to visit her mother and step-father. We did a rapid COVID swab as a precaution on 09/07/20. Result was negative.   Malachi Bonds, MD [facility]

## 2020-09-22 ENCOUNTER — Other Ambulatory Visit: Payer: Self-pay | Admitting: "Endocrinology

## 2020-09-22 MED FILL — OMEPRAZOLE 20 MG CAP: 20 | 90 days supply | Qty: 90 | Fill #1

## 2020-09-22 MED FILL — LEVOTHYROXINE 100 MCG TAB: 100 | 30 days supply | Qty: 30 | Fill #0

## 2020-09-25 ENCOUNTER — Other Ambulatory Visit: Payer: Self-pay | Admitting: "Endocrinology

## 2020-09-28 MED FILL — LEVOCETIRIZINE 5 MG TABLET: 5 | 90 days supply | Qty: 90 | Fill #1

## 2020-09-30 ENCOUNTER — Other Ambulatory Visit: Payer: Self-pay | Admitting: "Endocrinology

## 2020-09-30 ENCOUNTER — Other Ambulatory Visit: Payer: Self-pay

## 2020-09-30 MED ORDER — LEVOTHYROXINE SODIUM 100 MCG PO TABS
100.0000 ug | ORAL_TABLET | Freq: Every day | ORAL | 0 refills | Status: DC
Start: 2020-09-30 — End: 2020-09-30

## 2020-10-16 MED FILL — LEVOTHYROXINE 100 MCG TAB: 100 | 30 days supply | Qty: 30 | Fill #0

## 2020-10-19 ENCOUNTER — Other Ambulatory Visit: Payer: Self-pay | Admitting: Family Medicine

## 2020-10-19 DIAGNOSIS — Z1231 Encounter for screening mammogram for malignant neoplasm of breast: Secondary | ICD-10-CM

## 2020-11-02 ENCOUNTER — Other Ambulatory Visit: Payer: Self-pay

## 2020-11-02 DIAGNOSIS — E782 Mixed hyperlipidemia: Secondary | ICD-10-CM

## 2020-11-02 MED ORDER — ATORVASTATIN CALCIUM 40 MG PO TABS: 40.0000 mg | ORAL_TABLET | Freq: Every day | ORAL | 0 refills | Status: DC

## 2020-11-02 MED FILL — ATORVASTATIN 40 MG TABLET: 40 | 30 days supply | Qty: 30 | Fill #0

## 2020-11-09 MED FILL — LEVOTHYROXINE 100 MCG TAB: 100 | 30 days supply | Qty: 30 | Fill #0

## 2020-11-10 ENCOUNTER — Other Ambulatory Visit: Payer: Self-pay | Admitting: "Endocrinology

## 2020-11-13 ENCOUNTER — Other Ambulatory Visit: Payer: Self-pay | Admitting: "Endocrinology

## 2020-11-13 ENCOUNTER — Other Ambulatory Visit (HOSPITAL_COMMUNITY): Payer: Self-pay | Admitting: "Endocrinology

## 2020-11-19 ENCOUNTER — Ambulatory Visit: Payer: No Typology Code available for payment source

## 2020-11-26 ENCOUNTER — Other Ambulatory Visit: Payer: Self-pay | Admitting: Family Medicine

## 2020-11-26 DIAGNOSIS — E782 Mixed hyperlipidemia: Secondary | ICD-10-CM

## 2020-12-02 ENCOUNTER — Ambulatory Visit: Payer: No Typology Code available for payment source

## 2020-12-03 ENCOUNTER — Other Ambulatory Visit (HOSPITAL_BASED_OUTPATIENT_CLINIC_OR_DEPARTMENT_OTHER): Payer: Self-pay

## 2020-12-10 ENCOUNTER — Ambulatory Visit: Payer: No Typology Code available for payment source

## 2020-12-17 ENCOUNTER — Other Ambulatory Visit (HOSPITAL_COMMUNITY): Payer: Self-pay

## 2020-12-23 ENCOUNTER — Ambulatory Visit: Payer: No Typology Code available for payment source

## 2021-01-05 ENCOUNTER — Other Ambulatory Visit: Payer: Self-pay | Admitting: Family Medicine

## 2021-01-05 ENCOUNTER — Other Ambulatory Visit (HOSPITAL_COMMUNITY): Payer: Self-pay

## 2021-01-05 DIAGNOSIS — E782 Mixed hyperlipidemia: Secondary | ICD-10-CM

## 2021-01-05 MED FILL — Omeprazole Cap Delayed Release 20 MG: ORAL | 90 days supply | Qty: 90 | Fill #0 | Status: AC

## 2021-01-05 MED FILL — Levocetirizine Dihydrochloride Tab 5 MG: ORAL | 90 days supply | Qty: 90 | Fill #0 | Status: AC

## 2021-01-05 MED FILL — Levothyroxine Sodium Tab 100 MCG: ORAL | 30 days supply | Qty: 30 | Fill #0 | Status: AC

## 2021-01-06 ENCOUNTER — Other Ambulatory Visit (HOSPITAL_COMMUNITY): Payer: Self-pay

## 2021-01-07 ENCOUNTER — Other Ambulatory Visit (HOSPITAL_COMMUNITY): Payer: Self-pay

## 2021-01-07 ENCOUNTER — Telehealth: Payer: Self-pay | Admitting: Allergy & Immunology

## 2021-01-07 DIAGNOSIS — E782 Mixed hyperlipidemia: Secondary | ICD-10-CM

## 2021-01-07 MED ORDER — ATORVASTATIN CALCIUM 40 MG PO TABS
40.0000 mg | ORAL_TABLET | Freq: Every day | ORAL | 3 refills | Status: DC
Start: 2021-01-07 — End: 2021-02-26
  Filled 2021-01-07: qty 90, 90d supply, fill #0

## 2021-01-07 NOTE — Telephone Encounter (Signed)
Refilled atorvastatin.  Salvatore Marvel, MD [facility] and [place] [facility] [place]

## 2021-01-13 ENCOUNTER — Ambulatory Visit: Payer: No Typology Code available for payment source

## 2021-01-27 ENCOUNTER — Ambulatory Visit: Payer: No Typology Code available for payment source

## 2021-02-17 ENCOUNTER — Ambulatory Visit: Payer: No Typology Code available for payment source

## 2021-02-26 ENCOUNTER — Encounter: Payer: Self-pay | Admitting: Family Medicine

## 2021-02-26 ENCOUNTER — Ambulatory Visit (INDEPENDENT_AMBULATORY_CARE_PROVIDER_SITE_OTHER): Payer: No Typology Code available for payment source | Admitting: Family Medicine

## 2021-02-26 ENCOUNTER — Other Ambulatory Visit: Payer: Self-pay

## 2021-02-26 ENCOUNTER — Other Ambulatory Visit (HOSPITAL_COMMUNITY): Payer: Self-pay

## 2021-02-26 VITALS — BP 126/87 | HR 82 | Temp 98.2°F | Ht 64.5 in | Wt 191.0 lb

## 2021-02-26 DIAGNOSIS — G43709 Chronic migraine without aura, not intractable, without status migrainosus: Secondary | ICD-10-CM

## 2021-02-26 DIAGNOSIS — Z1159 Encounter for screening for other viral diseases: Secondary | ICD-10-CM | POA: Diagnosis not present

## 2021-02-26 DIAGNOSIS — Z1211 Encounter for screening for malignant neoplasm of colon: Secondary | ICD-10-CM

## 2021-02-26 DIAGNOSIS — E782 Mixed hyperlipidemia: Secondary | ICD-10-CM

## 2021-02-26 DIAGNOSIS — Z131 Encounter for screening for diabetes mellitus: Secondary | ICD-10-CM

## 2021-02-26 DIAGNOSIS — E890 Postprocedural hypothyroidism: Secondary | ICD-10-CM

## 2021-02-26 MED ORDER — ATORVASTATIN CALCIUM 40 MG PO TABS
40.0000 mg | ORAL_TABLET | Freq: Every day | ORAL | 3 refills | Status: DC
  Filled 2021-02-26 – 2021-05-12 (×2): qty 90, 90d supply, fill #0
  Filled 2021-08-20: qty 90, 90d supply, fill #1
  Filled 2021-11-23: qty 90, 90d supply, fill #2

## 2021-02-26 MED ORDER — OMEPRAZOLE 20 MG PO CPDR
DELAYED_RELEASE_CAPSULE | Freq: Every day | ORAL | 3 refills | Status: DC
  Filled 2021-02-26: qty 90, fill #0
  Filled 2021-03-19: qty 90, 90d supply, fill #0
  Filled 2021-06-22: qty 90, 90d supply, fill #1
  Filled 2021-09-20: qty 90, 90d supply, fill #2
  Filled 2021-12-27: qty 90, 90d supply, fill #3

## 2021-02-26 MED ORDER — AMITRIPTYLINE HCL 50 MG PO TABS
50.0000 mg | ORAL_TABLET | Freq: Every evening | ORAL | 3 refills | Status: DC
  Filled 2021-02-26: qty 90, 90d supply, fill #0
  Filled 2021-09-20: qty 90, 90d supply, fill #1
  Filled 2021-12-27: qty 90, 90d supply, fill #2

## 2021-02-26 NOTE — Patient Instructions (Signed)
Health Maintenance, Female Adopting a healthy lifestyle and getting preventive care are important in promoting health and wellness. Ask your health care provider about: The right schedule for you to have regular tests and exams. Things you can do on your own to prevent diseases and keep yourself healthy. What should I know about diet, weight, and exercise? Eat a healthy diet  Eat a diet that includes plenty of vegetables, fruits, low-fat dairy products, and lean protein. Do not eat a lot of foods that are high in solid fats, added sugars, or sodium.  Maintain a healthy weight Body mass index (BMI) is used to identify weight problems. It estimates body fat based on height and weight. Your health care provider can help determineyour BMI and help you achieve or maintain a healthy weight. Get regular exercise Get regular exercise. This is one of the most important things you can do for your health. Most adults should: Exercise for at least 150 minutes each week. The exercise should increase your heart rate and make you sweat (moderate-intensity exercise). Do strengthening exercises at least twice a week. This is in addition to the moderate-intensity exercise. Spend less time sitting. Even light physical activity can be beneficial. Watch cholesterol and blood lipids Have your blood tested for lipids and cholesterol at 49 years of age, then havethis test every 5 years. Have your cholesterol levels checked more often if: Your lipid or cholesterol levels are high. You are older than 49 years of age. You are at high risk for heart disease. What should I know about cancer screening? Depending on your health history and family history, you may need to have cancer screening at various ages. This may include screening for: Breast cancer. Cervical cancer. Colorectal cancer. Skin cancer. Lung cancer. What should I know about heart disease, diabetes, and high blood pressure? Blood pressure and heart  disease High blood pressure causes heart disease and increases the risk of stroke. This is more likely to develop in people who have high blood pressure readings, are of African descent, or are overweight. Have your blood pressure checked: Every 3-5 years if you are 18-39 years of age. Every year if you are 40 years old or older. Diabetes Have regular diabetes screenings. This checks your fasting blood sugar level. Have the screening done: Once every three years after age 40 if you are at a normal weight and have a low risk for diabetes. More often and at a younger age if you are overweight or have a high risk for diabetes. What should I know about preventing infection? Hepatitis B If you have a higher risk for hepatitis B, you should be screened for this virus. Talk with your health care provider to find out if you are at risk forhepatitis B infection. Hepatitis C Testing is recommended for: Everyone born from 1945 through 1965. Anyone with known risk factors for hepatitis C. Sexually transmitted infections (STIs) Get screened for STIs, including gonorrhea and chlamydia, if: You are sexually active and are younger than 49 years of age. You are older than 49 years of age and your health care provider tells you that you are at risk for this type of infection. Your sexual activity has changed since you were last screened, and you are at increased risk for chlamydia or gonorrhea. Ask your health care provider if you are at risk. Ask your health care provider about whether you are at high risk for HIV. Your health care provider may recommend a prescription medicine to help   prevent HIV infection. If you choose to take medicine to prevent HIV, you should first get tested for HIV. You should then be tested every 3 months for as long as you are taking the medicine. Pregnancy If you are about to stop having your period (premenopausal) and you may become pregnant, seek counseling before you get  pregnant. Take 400 to 800 micrograms (mcg) of folic acid every day if you become pregnant. Ask for birth control (contraception) if you want to prevent pregnancy. Osteoporosis and menopause Osteoporosis is a disease in which the bones lose minerals and strength with aging. This can result in bone fractures. If you are 65 years old or older, or if you are at risk for osteoporosis and fractures, ask your health care provider if you should: Be screened for bone loss. Take a calcium or vitamin D supplement to lower your risk of fractures. Be given hormone replacement therapy (HRT) to treat symptoms of menopause. Follow these instructions at home: Lifestyle Do not use any products that contain nicotine or tobacco, such as cigarettes, e-cigarettes, and chewing tobacco. If you need help quitting, ask your health care provider. Do not use street drugs. Do not share needles. Ask your health care provider for help if you need support or information about quitting drugs. Alcohol use Do not drink alcohol if: Your health care provider tells you not to drink. You are pregnant, may be pregnant, or are planning to become pregnant. If you drink alcohol: Limit how much you use to 0-1 drink a day. Limit intake if you are breastfeeding. Be aware of how much alcohol is in your drink. In the [place] one drink equals one 12 oz bottle of beer (355 mL), one 5 oz glass of wine (148 mL), or one 1 oz glass of hard liquor (44 mL). General instructions Schedule regular health, dental, and eye exams. Stay current with your vaccines. Tell your health care provider if: You often feel depressed. You have ever been abused or do not feel safe at home. Summary Adopting a healthy lifestyle and getting preventive care are important in promoting health and wellness. Follow your health care provider's instructions about healthy diet, exercising, and getting tested or screened for diseases. Follow your health care provider's  instructions on monitoring your cholesterol and blood pressure. This information is not intended to replace advice given to you by your health care provider. Make sure you discuss any questions you have with your healthcare provider. Document Revised: 08/22/2018 Document Reviewed: 08/22/2018 Elsevier Patient Education  2022 Elsevier Inc.  

## 2021-02-26 NOTE — Progress Notes (Signed)
This visit occurred during the SARS-CoV-2 public health emergency.  Safety protocols were in place, including screening questions prior to the visit, additional usage of staff PPE, and extensive cleaning of exam room while observing appropriate contact time as indicated for disinfecting solutions.    [name], female  DOB: [birth year], 50 y.o.   MRN: [id number] Patient Care Team    Relationship Specialty Notifications Start End  Ma Hillock, DO PCP - General Family Medicine  04/12/18   Charisse March, MD Referring Physician Obstetrics and Gynecology  04/12/18   Sarina Ser, MD Referring Physician Endocrinology  04/12/18     Chief Complaint  Patient presents with   Annual Exam    Pt is not fasting     Subjective: Leslie Valencia is a 50 y.o.  Female  present for CPE. All past medical history, surgical history, allergies, family history, immunizations, medications and social history were updated in the electronic medical record today. All recent labs, ED visits and hospitalizations within the last year were reviewed.   Hyperlipidemia: Tolerating statin-Lipitor 40 mg in the evening.    Fhx of heart disease in [place]. Former smoker.  She is not fasting today. S/P total thyroidectomy Patient reports compliance with levothyroxine 100 mcg daily.  Chronic migraine without aura without status migrainosus, not intractable She reports compliance with amitriptyline 50 mg nightly.   Health maintenance:  Colonoscopy: no fhx. Routine screen at 58 Mammogram: 09/19/2019.> scheduled 03/04/2021 Cervical cancer screening: last pap: 2019, has scheduled.  Immunizations: tdap UTD 2020, Influenza UTD 2020 (encouraged yearly).  Covid x3 completed.  If desiring Shingrix after her birthday next year may schedule by nurse visit. Infectious disease screening: HIV and hep C completed DEXA: routine screen at 61 Assistive device: none Oxygen UYQ:IHKV Patient has a Dental  home. Hospitalizations/ED visits: reviewed  Depression screen [facility] 2/9 02/26/2021 08/16/2019 04/12/2018  Decreased Interest 0 0 0  Down, Depressed, Hopeless 0 0 0  PHQ - 2 Score 0 0 0   No flowsheet data found.  Immunization History  Administered Date(s) Administered   Influenza-Unspecified 06/06/2018   Moderna SARS-COV2 Booster Vaccination 07/31/2020   Moderna Sars-Covid-2 Vaccination 09/10/2019, 10/08/2019   Tdap 08/16/2019   Past Medical History:  Diagnosis Date   Allergy    Asthma    patient denies   Bronchitis    Esophagitis    GERD (gastroesophageal reflux disease)    History of kidney stones    Hyperlipidemia    Hyperthyroidism    Migraine    Multinodular goiter    Seasonal allergies    Vaginal delivery    x2   Allergies  Allergen Reactions   Morphine And Related Anaphylaxis   Mushroom Extract Complex Anaphylaxis   Norco [Hydrocodone-Acetaminophen] Hives    Stomach pain   Tramadol     Stomach pain    Past Surgical History:  Procedure Laterality Date   CHOLECYSTECTOMY  2005   THYROIDECTOMY  08/22/2018   THYROIDECTOMY N/A 08/22/2018   Procedure: TOTAL THYROIDECTOMY;  Surgeon: Ralene Ok, MD;  Location: [facility] OR;  Service: General;  Laterality: N/A;   Family History  Problem Relation Age of Onset   Migraines Mother    Alcohol abuse Father    Arthritis Father    Depression Father    Early death Father    Heart disease Maternal Grandmother    Hyperlipidemia Maternal Grandmother    Social History   Social History Narrative   Marital status/children/pets: married, 2  children.    Education/employment: BSN   Safety:      -Wears a bicycle helmet riding a bike: Yes     -smoke alarm in the home:Yes     - wears seatbelt: Yes     - Feels safe in their relationships: Yes    Allergies as of 02/26/2021       Reactions   Morphine And Related Anaphylaxis   Mushroom Extract Complex Anaphylaxis   Norco [hydrocodone-acetaminophen] Hives   Stomach pain    Tramadol    Stomach pain         Medication List        Accurate as of February 26, 2021  4:27 PM. If you have any questions, ask your nurse or doctor.          STOP taking these medications    amoxicillin-clavulanate 875-125 MG tablet Commonly known as: AUGMENTIN Stopped by: Howard Pouch, DO   metoprolol tartrate 25 MG tablet Commonly known as: LOPRESSOR Stopped by: Howard Pouch, DO       TAKE these medications    amitriptyline 50 MG tablet Commonly known as: ELAVIL Take 1 tablet (50 mg total) by mouth at bedtime. What changed:  medication strength how much to take how to take this when to take this additional instructions Another medication with the same name was removed. Continue taking this medication, and follow the directions you see here. Changed by: Howard Pouch, DO   atorvastatin 40 MG tablet Commonly known as: LIPITOR Take 1 tablet (40 mg total) by mouth daily.   b complex vitamins tablet Take 1 tablet by mouth daily.   D3 + K2 DOTS PO Take by mouth.   EPINEPHrine 0.3 mg/0.3 mL Soaj injection Commonly known as: Auvi-Q Inject 0.3 mLs (0.3 mg total) into the muscle as needed for anaphylaxis.   famotidine 20 MG tablet Commonly known as: PEPCID Take 20 mg by mouth at bedtime.   FISH OIL PO Take by mouth.   levocetirizine 5 MG tablet Commonly known as: XYZAL TAKE 1 TABLET BY MOUTH EVERY EVENING   levothyroxine 100 MCG tablet Commonly known as: SYNTHROID TAKE 1 TABLET BY MOUTH ONCE A DAY BEFORE BREAKFAST (NEEDS OFFICE VISIT) What changed: Another medication with the same name was removed. Continue taking this medication, and follow the directions you see here. Changed by: Howard Pouch, DO   lidocaine 2 % solution Commonly known as: XYLOCAINE Use as directed 15 mLs in the mouth or throat as needed for mouth pain.   magnesium oxide 400 (241.3 Mg) MG tablet Commonly known as: MAG-OX Take 1 tablet (400 mg total) by mouth 2 (two) times  daily.   Multi-Vitamin tablet Take by mouth.   omeprazole 20 MG capsule Commonly known as: PRILOSEC TAKE 1 CAPSULE BY MOUTH ONCE A DAY   triamcinolone 0.025 % cream Commonly known as: KENALOG Apply 1 application topically daily as needed.   TURMERIC PO Take by mouth.   VITAMIN C PO Take 1 tablet by mouth daily.        All past medical history, surgical history, allergies, family history, immunizations andmedications were updated in the EMR today and reviewed under the history and medication portions of their EMR.     No results found for this or any previous visit (from the past 2160 hour(s)).   No results found.  ROS: 14 pt review of systems performed and negative (unless mentioned in an HPI)  Objective: BP 126/87   Pulse 82   Temp  98.2 F (36.8 C) (Oral)   Ht 5' 4.5" (1.638 m)   Wt 191 lb (86.6 kg)   LMP 02/11/2015 (Approximate)   SpO2 97%   BMI 32.28 kg/m  Gen: Afebrile. No acute distress. Nontoxic in appearance, well-developed, well-nourished, very pleasant, overweight Caucasian female. HENT: AT. [place].  Left lateral upper eyelid with small varicosity.  Nontender to touch.  No erythema.  Bilateral TM visualized and normal in appearance, normal external auditory canal. MMM, no oral lesions, adequate dentition. Bilateral nares within normal limits. Throat without erythema, ulcerations or exudates.  No cough on exam, no hoarseness on exam. Eyes:Pupils Equal Round Reactive to light, Extraocular movements intact,  Conjunctiva without redness, discharge or icterus. Neck/lymp/endocrine: Supple, no lymphadenopathy, no thyromegaly-status post thyroidectomy-well healing  CV: RRR no murmur, no edema, +2/4 P posterior tibialis pulses. Chest: CTAB, no wheeze, rhonchi or crackles.  Normal respiratory effort.  Good air movement. Abd: Soft.  Flat. NTND. BS present.  No masses palpated. No hepatosplenomegaly. No rebound tenderness or guarding. Skin: No rashes, purpura or petechiae.  Warm and well-perfused. Skin intact. Neuro/Msk: Normal gait. PERLA. EOMi. Alert. Oriented x3.  Cranial nerves II through XII intact. Muscle strength 5/5 upper/lower extremity. DTRs equal bilaterally. Psych: Normal affect, dress and demeanor. Normal speech. Normal thought content and judgment.  No results found.  Assessment/plan: WALLY BEHAN is a 50 y.o. female present for CPE/CMC Mixed hyperlipidemia/obesity Routine exercise. Mediterranean diet or other dietary modifications. Continue Lipitor 40 mg daily -Lipid panel collected -nonfasting -CBC, CMP collected today  S/P total thyroidectomy/Postsurgical hypothyroidism Compliant with levothyroxine 100 mcg daily-refills will be provided after lab results received and dose adjusted if necessary. - TSH - T4, free -T3, free - Vitamin D (25 hydroxy)/PTH/Ca  Colon cancer screening - Cologuard Need for hepatitis C screening test - Hepatitis C antibody Diabetes mellitus screening - Hemoglobin A1c Chronic migraine without aura without status migrainosus, not intractable She has seen improvement with Elavil nightly. Continue 50 mg nightly - amitriptyline (ELAVIL) 50 MG tablet; Take 1 tablet (50 mg total) by mouth at bedtime.  Dispense: 90 tablet; Refill: 3    Return in about 1 year (around 02/26/2022) for CPE (30 min).[place], tremors needed  Orders Placed This Encounter  Procedures   CBC with Differential/Platelet   Comprehensive metabolic panel   Hemoglobin A1c   Lipid panel   TSH   T3, free   T4, free   Cologuard   Hepatitis C antibody   PTH, Intact and Calcium      Electronically signed by: Howard Pouch, [place]

## 2021-02-27 LAB — HEPATITIS C ANTIBODY: Hep C Virus Ab: 0.2 s/co ratio (ref 0.0–0.9)

## 2021-02-27 LAB — PTH, INTACT AND CALCIUM: PTH: 27 pg/mL (ref 15–65)

## 2021-02-27 LAB — LIPID PANEL
Chol/HDL Ratio: 3 ratio (ref 0.0–4.4)
Cholesterol, Total: 201 mg/dL — ABNORMAL HIGH (ref 100–199)
HDL: 67 mg/dL (ref >39)
LDL Chol Calc (NIH): 113 mg/dL — ABNORMAL HIGH (ref 0–99)
Triglycerides: 122 mg/dL (ref 0–149)
VLDL Cholesterol Cal: 21 mg/dL (ref 5–40)

## 2021-02-27 LAB — COMPREHENSIVE METABOLIC PANEL
ALT: 66 IU/L — ABNORMAL HIGH (ref 0–32)
AST: 40 IU/L (ref 0–40)
Albumin/Globulin Ratio: 2.6 — ABNORMAL HIGH (ref 1.2–2.2)
Albumin: 5.2 g/dL — ABNORMAL HIGH (ref 3.8–4.8)
Alkaline Phosphatase: 127 IU/L — ABNORMAL HIGH (ref 44–121)
BUN/Creatinine Ratio: 10 (ref 9–23)
BUN: 9 mg/dL (ref 6–24)
Bilirubin Total: 0.5 mg/dL (ref 0.0–1.2)
CO2: 23 mmol/L (ref 20–29)
Calcium: 10.2 mg/dL (ref 8.7–10.2)
Chloride: 100 mmol/L (ref 96–106)
Creatinine, Ser: 0.9 mg/dL (ref 0.57–1.00)
Globulin, Total: 2 g/dL (ref 1.5–4.5)
Glucose: 87 mg/dL (ref 65–99)
Potassium: 4.3 mmol/L (ref 3.5–5.2)
Sodium: 141 mmol/L (ref 134–144)
Total Protein: 7.2 g/dL (ref 6.0–8.5)
eGFR: 78 mL/min/{1.73_m2} (ref >59)

## 2021-02-27 LAB — CBC WITH DIFFERENTIAL/PLATELET
Basophils Absolute: 0 10*3/uL (ref 0.0–0.2)
Basos: 0 %
EOS (ABSOLUTE): 0.1 10*3/uL (ref 0.0–0.4)
Eos: 2 %
Hematocrit: 44.3 % (ref 34.0–46.6)
Hemoglobin: 15.3 g/dL (ref 11.1–15.9)
Immature Grans (Abs): 0 10*3/uL (ref 0.0–0.1)
Immature Granulocytes: 0 %
Lymphocytes Absolute: 2.7 10*3/uL (ref 0.7–3.1)
Lymphs: 38 %
MCH: 31.5 pg (ref 26.6–33.0)
MCHC: 34.5 g/dL (ref 31.5–35.7)
MCV: 91 fL (ref 79–97)
Monocytes Absolute: 0.5 10*3/uL (ref 0.1–0.9)
Monocytes: 8 %
Neutrophils Absolute: 3.7 10*3/uL (ref 1.4–7.0)
Neutrophils: 52 %
Platelets: 251 10*3/uL (ref 150–450)
RBC: 4.86 x10E6/uL (ref 3.77–5.28)
RDW: 12.8 % (ref 11.7–15.4)
WBC: 7.1 10*3/uL (ref 3.4–10.8)

## 2021-02-27 LAB — T3, FREE: T3, Free: 3 pg/mL (ref 2.0–4.4)

## 2021-02-27 LAB — HEMOGLOBIN A1C
Est. average glucose Bld gHb Est-mCnc: 117 mg/dL
Hgb A1c MFr Bld: 5.7 % — ABNORMAL HIGH (ref 4.8–5.6)

## 2021-02-27 LAB — TSH: TSH: 0.734 u[IU]/mL (ref 0.450–4.500)

## 2021-02-27 LAB — T4, FREE: Free T4: 1.6 ng/dL (ref 0.82–1.77)

## 2021-03-02 ENCOUNTER — Other Ambulatory Visit: Payer: Self-pay | Admitting: Family Medicine

## 2021-03-02 ENCOUNTER — Other Ambulatory Visit (HOSPITAL_COMMUNITY): Payer: Self-pay

## 2021-03-02 MED ORDER — LEVOTHYROXINE SODIUM 100 MCG PO TABS
100.0000 ug | ORAL_TABLET | Freq: Every day | ORAL | 3 refills | Status: DC
  Filled 2021-03-02: qty 90, 90d supply, fill #0
  Filled 2021-06-30: qty 90, 90d supply, fill #1
  Filled 2021-10-01: qty 90, 90d supply, fill #2
  Filled 2021-12-27: qty 90, 90d supply, fill #3

## 2021-03-04 ENCOUNTER — Ambulatory Visit (INDEPENDENT_AMBULATORY_CARE_PROVIDER_SITE_OTHER): Payer: No Typology Code available for payment source

## 2021-03-04 ENCOUNTER — Other Ambulatory Visit: Payer: Self-pay

## 2021-03-04 DIAGNOSIS — Z1231 Encounter for screening mammogram for malignant neoplasm of breast: Secondary | ICD-10-CM

## 2021-03-19 MED FILL — Levocetirizine Dihydrochloride Tab 5 MG: ORAL | 90 days supply | Qty: 90 | Fill #1 | Status: AC

## 2021-03-20 ENCOUNTER — Other Ambulatory Visit (HOSPITAL_COMMUNITY): Payer: Self-pay

## 2021-03-29 ENCOUNTER — Other Ambulatory Visit (HOSPITAL_COMMUNITY): Payer: Self-pay

## 2021-05-12 ENCOUNTER — Other Ambulatory Visit (HOSPITAL_COMMUNITY): Payer: Self-pay

## 2021-06-21 ENCOUNTER — Other Ambulatory Visit: Payer: Self-pay

## 2021-06-21 ENCOUNTER — Ambulatory Visit (INDEPENDENT_AMBULATORY_CARE_PROVIDER_SITE_OTHER): Payer: No Typology Code available for payment source

## 2021-06-21 DIAGNOSIS — Z23 Encounter for immunization: Secondary | ICD-10-CM

## 2021-06-21 NOTE — Progress Notes (Signed)
   Covid-19 Vaccination Clinic  Name:  SHAWNETTA LEIN    MRN: [id number] DOB: [birth year]  06/21/2021  Ms. Kabler was observed post Covid-19 immunization for 15 minutes without incident. She was provided with Vaccine Information Sheet and instruction to access the V-Safe system.   Ms. Boeh was instructed to call 911 with any severe reactions post vaccine: Difficulty breathing  Swelling of face and throat  A fast heartbeat  A bad rash all over body  Dizziness and weakness

## 2021-06-22 ENCOUNTER — Other Ambulatory Visit: Payer: Self-pay | Admitting: Allergy & Immunology

## 2021-06-23 ENCOUNTER — Other Ambulatory Visit (HOSPITAL_COMMUNITY): Payer: Self-pay

## 2021-06-23 MED ORDER — LEVOCETIRIZINE DIHYDROCHLORIDE 5 MG PO TABS
ORAL_TABLET | Freq: Every evening | ORAL | 0 refills | Status: DC
Start: 2021-06-23 — End: 2021-08-20
  Filled 2021-06-23: qty 90, 90d supply, fill #0

## 2021-06-30 ENCOUNTER — Other Ambulatory Visit (HOSPITAL_COMMUNITY): Payer: Self-pay

## 2021-07-17 ENCOUNTER — Telehealth: Payer: Self-pay | Admitting: Allergy & Immunology

## 2021-07-17 MED ORDER — LIDOCAINE VISCOUS HCL 2 % MT SOLN: 15.0000 mL | OROMUCOSAL | 2 refills | Status: DC | PRN

## 2021-07-17 NOTE — Telephone Encounter (Signed)
Patient requested refill of her viscous lidocaine. Sent script.   Salvatore Marvel, MD [facility] and [place] [facility] [place]

## 2021-08-12 ENCOUNTER — Telehealth: Payer: Self-pay | Admitting: Allergy & Immunology

## 2021-08-12 MED ORDER — AMOXICILLIN-POT CLAVULANATE 875-125 MG PO TABS: 1.0000 | ORAL_TABLET | Freq: Two times a day (BID) | ORAL | 0 refills | Status: AC

## 2021-08-12 NOTE — Telephone Encounter (Signed)
Patient called reporting sinus pressure and pain for several days.  She has been on over-the-counter medications including nasal sprays without improvement.  Therefore, I will send in Augmentin.  Salvatore Marvel, MD All[facility] [place] [facility] [place]

## 2021-08-20 ENCOUNTER — Other Ambulatory Visit: Payer: Self-pay | Admitting: Allergy & Immunology

## 2021-08-21 ENCOUNTER — Other Ambulatory Visit (HOSPITAL_COMMUNITY): Payer: Self-pay

## 2021-08-23 ENCOUNTER — Other Ambulatory Visit (HOSPITAL_COMMUNITY): Payer: Self-pay

## 2021-08-23 MED ORDER — LEVOCETIRIZINE DIHYDROCHLORIDE 5 MG PO TABS
ORAL_TABLET | Freq: Every evening | ORAL | 1 refills | Status: DC
  Filled 2021-08-23: qty 90, 90d supply, fill #0
  Filled 2021-12-27: qty 90, 90d supply, fill #1

## 2021-09-07 ENCOUNTER — Other Ambulatory Visit (HOSPITAL_COMMUNITY): Payer: Self-pay

## 2021-09-16 ENCOUNTER — Encounter: Payer: Self-pay | Admitting: Family Medicine

## 2021-09-18 ENCOUNTER — Other Ambulatory Visit (HOSPITAL_COMMUNITY): Payer: Self-pay

## 2021-09-20 ENCOUNTER — Other Ambulatory Visit (HOSPITAL_COMMUNITY): Payer: Self-pay

## 2021-10-01 ENCOUNTER — Other Ambulatory Visit (HOSPITAL_COMMUNITY): Payer: Self-pay

## 2021-11-23 ENCOUNTER — Other Ambulatory Visit (HOSPITAL_COMMUNITY): Payer: Self-pay

## 2021-12-27 ENCOUNTER — Other Ambulatory Visit (HOSPITAL_COMMUNITY): Payer: Self-pay

## 2022-01-01 IMAGING — MG MM DIGITAL SCREENING BILAT W/ TOMO AND CAD
8 series · 8 of 24 positions shown · non-contrast
Comparison: Previous exam(s).

CLINICAL DATA: Screening.

EXAM:
DIGITAL SCREENING BILATERAL MAMMOGRAM WITH TOMOSYNTHESIS AND CAD
TECHNIQUE: Bilateral screening digital craniocaudal and mediolateral oblique
mammograms were obtained. Bilateral screening digital breast
tomosynthesis was performed. The images were evaluated with
computer-aided detection.

[L MLO synth-2D]
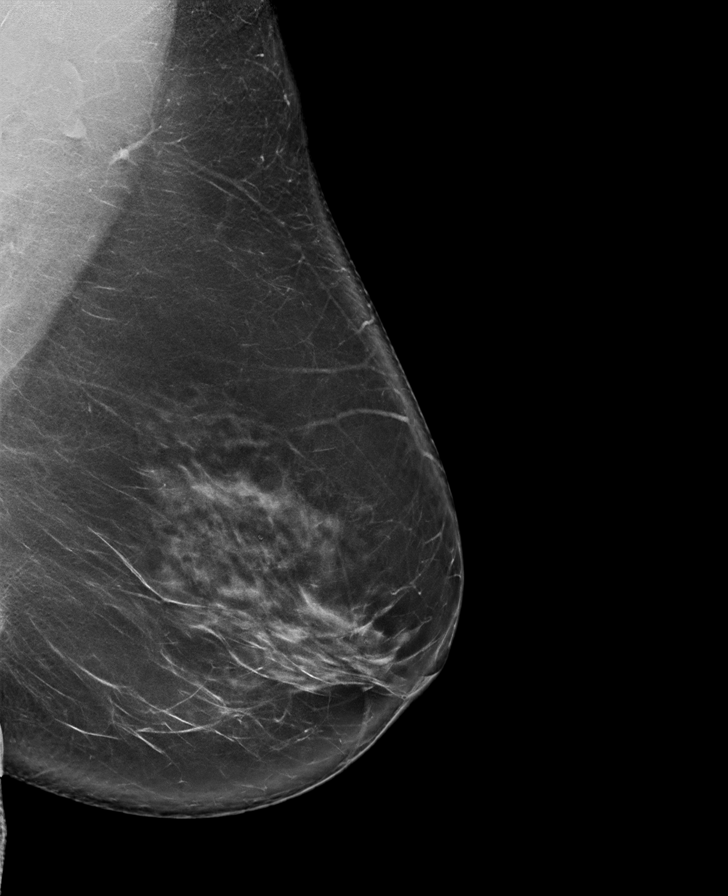

[L CC synth-2D]
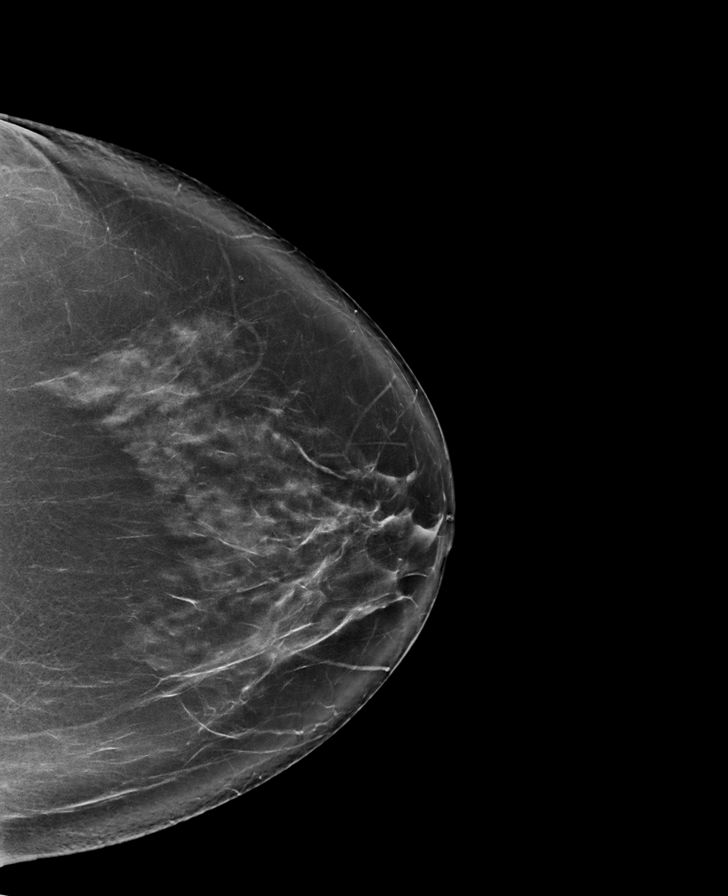

[R MLO synth-2D]
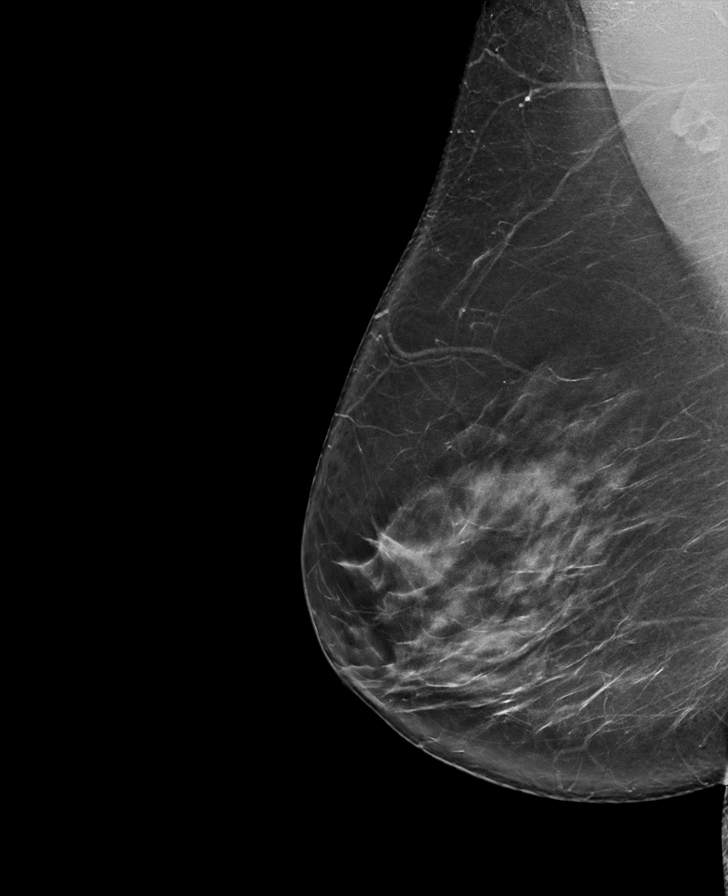

[R CC synth-2D]
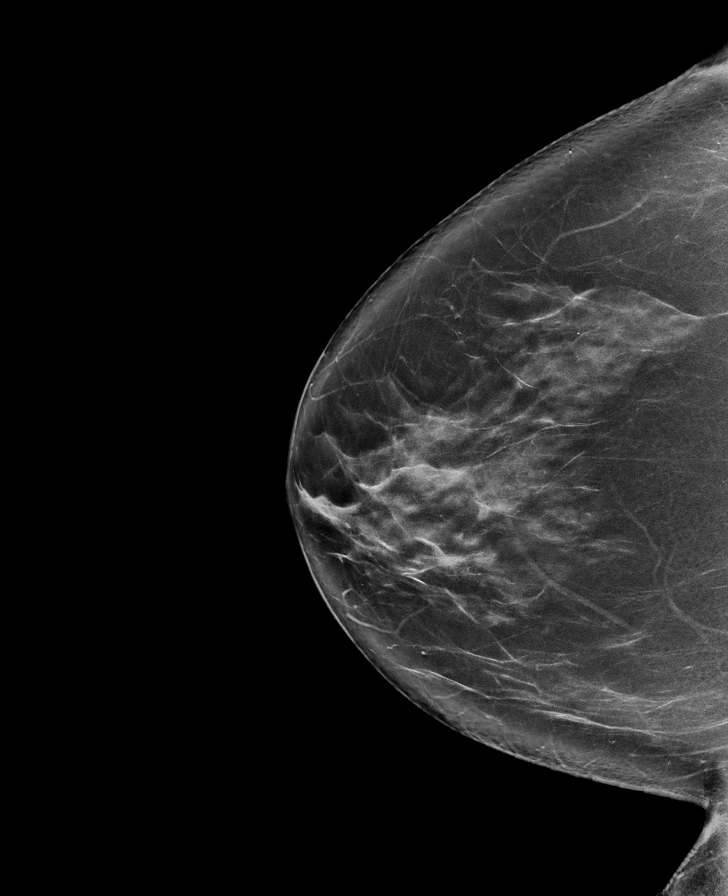

[L MLO tomo · tomo slice 49/97.0]
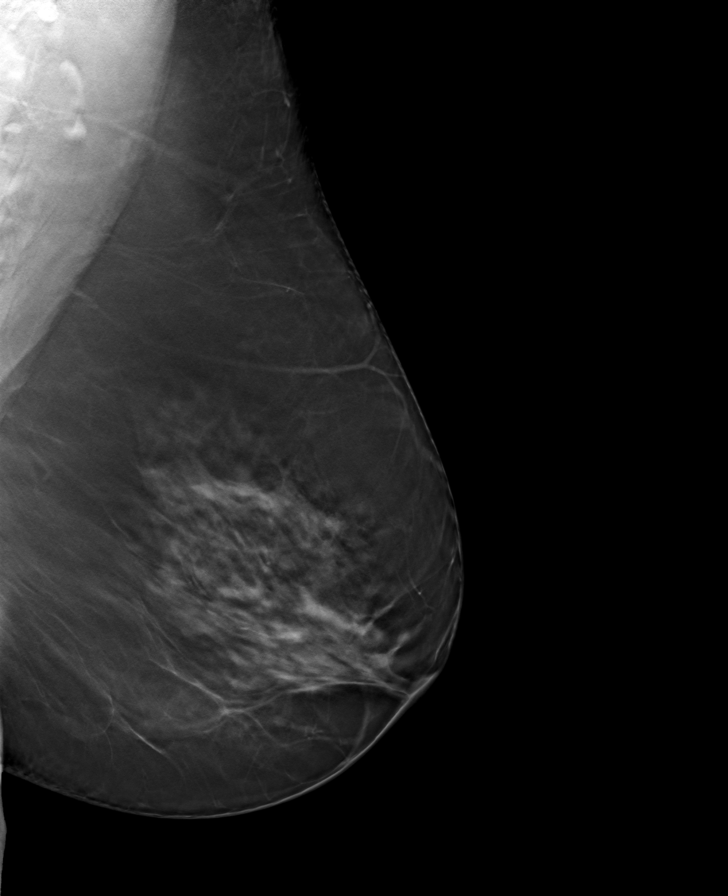

[R MLO tomo · tomo slice 46/91.0]
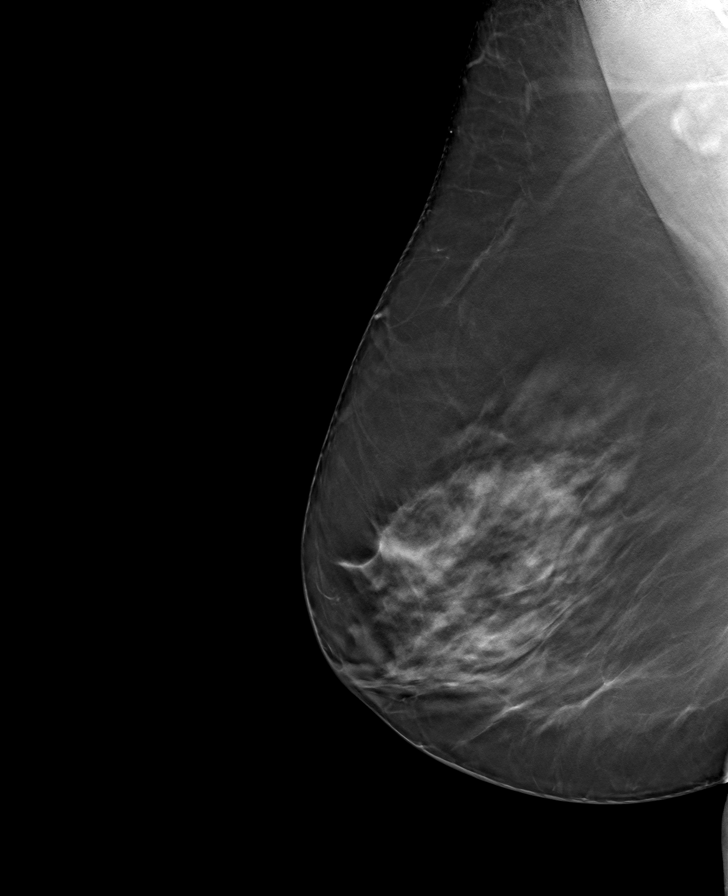

[L CC tomo · tomo slice 45/90.0]
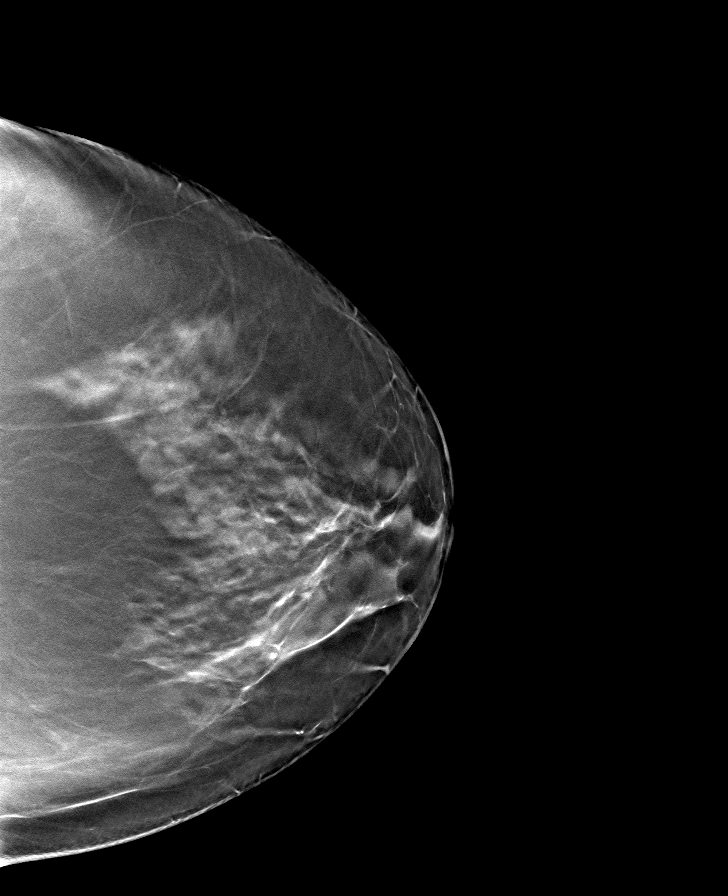

[R CC tomo · tomo slice 45/88.0]
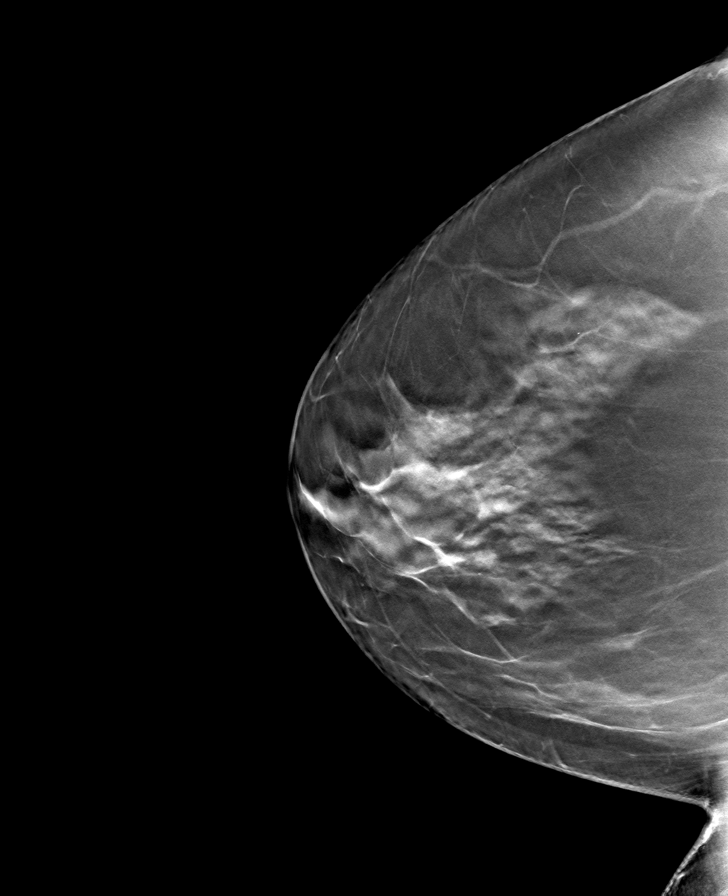

[8 of 24 positions shown; findings below may reference images not displayed]

ACR Breast Density Category c: The breast tissue is heterogeneously
dense, which may obscure small masses.
FINDINGS: There are no findings suspicious for malignancy.
IMPRESSION: No mammographic evidence of malignancy. A result letter of this
screening mammogram will be mailed directly to the patient.

RECOMMENDATION:
Screening mammogram in one year. (Code:Q3-W-BC3)

BI-RADS CATEGORY  1: Negative.

## 2022-01-24 ENCOUNTER — Other Ambulatory Visit (HOSPITAL_COMMUNITY): Payer: Self-pay

## 2022-01-24 MED ORDER — LIDOCAINE VISCOUS HCL 2 % MT SOLN
15.0000 mL | OROMUCOSAL | 2 refills | Status: DC | PRN
  Filled 2022-01-24: qty 100, fill #0

## 2022-01-24 NOTE — Telephone Encounter (Signed)
Sent in refill for the viscous lidocaine.  ? ?Salvatore Marvel, MD ?[facility] and [place] [facility] ? ?

## 2022-01-24 NOTE — Addendum Note (Signed)
Addended by: Valentina Shaggy on: 01/24/2022 03:53 PM ? ? Modules accepted: Orders ? ?

## 2022-01-25 MED ORDER — LIDOCAINE VISCOUS HCL 2 % MT SOLN
15.0000 mL | OROMUCOSAL | 2 refills | Status: DC | PRN
  Filled 2022-04-26: qty 100, 10d supply, fill #0

## 2022-01-25 NOTE — Addendum Note (Signed)
Addended by: Valentina Shaggy on: 01/25/2022 01:10 PM ? ? Modules accepted: Orders ? ?

## 2022-02-17 ENCOUNTER — Telehealth: Payer: Self-pay

## 2022-02-17 DIAGNOSIS — D485 Neoplasm of uncertain behavior of skin: Secondary | ICD-10-CM | POA: Diagnosis not present

## 2022-02-17 DIAGNOSIS — L209 Atopic dermatitis, unspecified: Secondary | ICD-10-CM | POA: Diagnosis not present

## 2022-02-17 DIAGNOSIS — Z1211 Encounter for screening for malignant neoplasm of colon: Secondary | ICD-10-CM

## 2022-02-17 DIAGNOSIS — L918 Other hypertrophic disorders of the skin: Secondary | ICD-10-CM | POA: Diagnosis not present

## 2022-02-17 NOTE — Telephone Encounter (Signed)
Called pt to see if she has received the Cologuard that was ordered last year. Pt stated that she hasn't. Ordered placed.

## 2022-03-02 ENCOUNTER — Other Ambulatory Visit: Payer: Self-pay | Admitting: Family Medicine

## 2022-03-02 DIAGNOSIS — E782 Mixed hyperlipidemia: Secondary | ICD-10-CM

## 2022-03-03 ENCOUNTER — Other Ambulatory Visit (HOSPITAL_COMMUNITY): Payer: Self-pay

## 2022-03-03 MED ORDER — ATORVASTATIN CALCIUM 40 MG PO TABS
40.0000 mg | ORAL_TABLET | Freq: Every day | ORAL | 0 refills | Status: DC
  Filled 2022-03-03: qty 30, 30d supply, fill #0

## 2022-03-09 ENCOUNTER — Other Ambulatory Visit (HOSPITAL_COMMUNITY): Payer: Self-pay

## 2022-03-09 ENCOUNTER — Encounter: Payer: No Typology Code available for payment source | Admitting: Family Medicine

## 2022-03-09 ENCOUNTER — Telehealth: Payer: Self-pay

## 2022-03-09 ENCOUNTER — Other Ambulatory Visit: Payer: Self-pay | Admitting: Allergy & Immunology

## 2022-03-09 NOTE — Telephone Encounter (Signed)
No Show/Cancel within 24 hours  pt called at 9:15am to cancel appt - she is sick - declined virtual - rescheduled cpe to 7/19 / 1st time n/s - fee will be waived

## 2022-03-09 NOTE — Telephone Encounter (Signed)
noted 

## 2022-03-11 ENCOUNTER — Encounter: Payer: Self-pay | Admitting: Family Medicine

## 2022-03-21 ENCOUNTER — Other Ambulatory Visit: Payer: Self-pay | Admitting: Family Medicine

## 2022-03-21 ENCOUNTER — Other Ambulatory Visit (HOSPITAL_COMMUNITY): Payer: Self-pay

## 2022-03-21 ENCOUNTER — Other Ambulatory Visit: Payer: Self-pay | Admitting: Allergy & Immunology

## 2022-03-21 DIAGNOSIS — G43709 Chronic migraine without aura, not intractable, without status migrainosus: Secondary | ICD-10-CM

## 2022-03-21 MED ORDER — OMEPRAZOLE 20 MG PO CPDR
DELAYED_RELEASE_CAPSULE | Freq: Every day | ORAL | 0 refills | Status: DC
  Filled 2022-03-21: qty 30, 30d supply, fill #0

## 2022-03-21 MED ORDER — AMITRIPTYLINE HCL 50 MG PO TABS
50.0000 mg | ORAL_TABLET | Freq: Every evening | ORAL | 0 refills | Status: DC
  Filled 2022-03-21: qty 30, 30d supply, fill #0

## 2022-03-21 MED ORDER — LEVOTHYROXINE SODIUM 100 MCG PO TABS
100.0000 ug | ORAL_TABLET | Freq: Every day | ORAL | 0 refills | Status: DC
  Filled 2022-03-21: qty 30, 30d supply, fill #0

## 2022-03-23 ENCOUNTER — Other Ambulatory Visit (HOSPITAL_COMMUNITY): Payer: Self-pay

## 2022-03-28 ENCOUNTER — Other Ambulatory Visit: Payer: Self-pay | Admitting: Family Medicine

## 2022-03-28 ENCOUNTER — Other Ambulatory Visit: Payer: Self-pay | Admitting: Allergy & Immunology

## 2022-03-28 ENCOUNTER — Other Ambulatory Visit (HOSPITAL_COMMUNITY): Payer: Self-pay

## 2022-03-28 DIAGNOSIS — E782 Mixed hyperlipidemia: Secondary | ICD-10-CM

## 2022-03-28 MED ORDER — LEVOCETIRIZINE DIHYDROCHLORIDE 5 MG PO TABS
ORAL_TABLET | Freq: Every evening | ORAL | 1 refills | Status: DC
  Filled 2022-03-28: qty 90, 90d supply, fill #0
  Filled 2022-06-21: qty 90, 90d supply, fill #1

## 2022-03-29 ENCOUNTER — Other Ambulatory Visit (HOSPITAL_COMMUNITY): Payer: Self-pay

## 2022-03-30 ENCOUNTER — Ambulatory Visit (INDEPENDENT_AMBULATORY_CARE_PROVIDER_SITE_OTHER): Payer: 59 | Admitting: Family Medicine

## 2022-03-30 ENCOUNTER — Other Ambulatory Visit (HOSPITAL_COMMUNITY): Payer: Self-pay

## 2022-03-30 ENCOUNTER — Encounter: Payer: Self-pay | Admitting: Family Medicine

## 2022-03-30 VITALS — BP 124/83 | HR 91 | Temp 97.8°F | Ht 65.0 in | Wt 199.0 lb

## 2022-03-30 DIAGNOSIS — Z1231 Encounter for screening mammogram for malignant neoplasm of breast: Secondary | ICD-10-CM | POA: Diagnosis not present

## 2022-03-30 DIAGNOSIS — G43709 Chronic migraine without aura, not intractable, without status migrainosus: Secondary | ICD-10-CM

## 2022-03-30 DIAGNOSIS — E669 Obesity, unspecified: Secondary | ICD-10-CM | POA: Insufficient documentation

## 2022-03-30 DIAGNOSIS — K219 Gastro-esophageal reflux disease without esophagitis: Secondary | ICD-10-CM

## 2022-03-30 DIAGNOSIS — Z79899 Other long term (current) drug therapy: Secondary | ICD-10-CM | POA: Diagnosis not present

## 2022-03-30 DIAGNOSIS — Z0000 Encounter for general adult medical examination without abnormal findings: Secondary | ICD-10-CM

## 2022-03-30 DIAGNOSIS — E782 Mixed hyperlipidemia: Secondary | ICD-10-CM

## 2022-03-30 DIAGNOSIS — E890 Postprocedural hypothyroidism: Secondary | ICD-10-CM

## 2022-03-30 MED ORDER — ATORVASTATIN CALCIUM 40 MG PO TABS
40.0000 mg | ORAL_TABLET | Freq: Every day | ORAL | 3 refills | Status: DC
  Filled 2022-03-30: qty 90, 90d supply, fill #0

## 2022-03-30 MED ORDER — OMEPRAZOLE 20 MG PO CPDR
DELAYED_RELEASE_CAPSULE | Freq: Every day | ORAL | 3 refills | Status: DC
  Filled 2022-03-30: qty 90, fill #0
  Filled 2022-04-20: qty 90, 90d supply, fill #0
  Filled 2022-07-20: qty 90, 90d supply, fill #1
  Filled 2022-10-13: qty 90, 90d supply, fill #2
  Filled 2023-01-12: qty 90, 90d supply, fill #3

## 2022-03-30 MED ORDER — AMITRIPTYLINE HCL 50 MG PO TABS
50.0000 mg | ORAL_TABLET | Freq: Every evening | ORAL | 3 refills | Status: DC
  Filled 2022-03-30 – 2022-04-20 (×2): qty 90, 90d supply, fill #0
  Filled 2022-07-20: qty 90, 90d supply, fill #1
  Filled 2022-10-13: qty 90, 90d supply, fill #2
  Filled 2023-01-12: qty 90, 90d supply, fill #3

## 2022-03-30 NOTE — Patient Instructions (Addendum)

## 2022-03-30 NOTE — Progress Notes (Signed)
[name], female  DOB: 31-Oct-1970, 51 y.o.   MRN: [id number] Patient Care Team    Relationship Specialty Notifications Start End  Ma Hillock, DO PCP - General Family Medicine  04/12/18   Charisse March, MD Referring Physician Obstetrics and Gynecology  04/12/18   Sarina Ser, MD Referring Physician Endocrinology  04/12/18     Chief Complaint  Patient presents with   Annual Exam    Pt is not fasting     Subjective: Leslie Valencia is a 51 y.o.  Female  present for CPE All past medical history, surgical history, allergies, family history, immunizations, medications and social history were updated in the electronic medical record today. All recent labs, ED visits and hospitalizations within the last year were reviewed.     Health maintenance:  Colonoscopy: no fhx. Routine screen at 45> due> she has kit at home Mammogram: last mam 03/04/2021> ordered today [facility] Cervical cancer screening: last pap: 2019, has scheduled.  Immunizations: tdap 08/16/2019 , Influenza UTD (encouraged yearly).  Shingrix declined Infectious disease screening: HIV likely completed with pregnancy. Hep C screen completed. DEXA: routine screen at 60-65 Assistive device: none Oxygen EQU:HKIS Patient has a Dental home. Hospitalizations/ED visits: reviewed     02/26/2021    1:12 PM 08/16/2019   10:04 AM 04/12/2018    9:57 AM  Depression screen PHQ 2/9  Decreased Interest 0 0 0  Down, Depressed, Hopeless 0 0 0  PHQ - 2 Score 0 0 0       No data to display          Immunization History  Administered Date(s) Administered   Influenza-Unspecified 06/06/2018   Moderna Covid-19 Vaccine Bivalent Booster 17yr & up 06/21/2021   Moderna SARS-COV2 Booster Vaccination 07/31/2020   Moderna Sars-Covid-2 Vaccination 09/10/2019, 10/08/2019   Tdap 08/16/2019     Past Medical History:  Diagnosis Date   Allergy    Asthma    patient denies   Bronchitis    Esophagitis    GERD  (gastroesophageal reflux disease)    History of kidney stones    Hyperlipidemia    Hyperthyroidism    Migraine    Multinodular goiter    S/P total thyroidectomy 08/22/2018   Seasonal allergies    Vaginal delivery    x2   Allergies  Allergen Reactions   Morphine And Related Anaphylaxis   Mushroom Extract Complex Anaphylaxis   Norco [Hydrocodone-Acetaminophen] Hives    Stomach pain   Tramadol     Stomach pain    Past Surgical History:  Procedure Laterality Date   CHOLECYSTECTOMY  2005   THYROIDECTOMY  08/22/2018   THYROIDECTOMY N/A 08/22/2018   Procedure: TOTAL THYROIDECTOMY;  Surgeon: RRalene Ok MD;  [facility];  Service: General;  Laterality: N/A;   Family History  Problem Relation Age of Onset   Migraines Mother    Alcohol abuse Father    Arthritis Father    Depression Father    Early death Father    Heart disease Maternal Grandmother    Hyperlipidemia Maternal Grandmother    Social History   Social History Narrative   Marital status/children/pets: married, 2 children.    Education/employment: BSN   Safety:      -Wears a bicycle helmet riding a bike: Yes     -smoke alarm in the home:Yes     - wears seatbelt: Yes     - Feels safe in their relationships: Yes  Allergies as of 03/30/2022       Reactions   Morphine And Related Anaphylaxis   Mushroom Extract Complex Anaphylaxis   Norco [hydrocodone-acetaminophen] Hives   Stomach pain   Tramadol    Stomach pain         Medication List        Accurate as of March 30, 2022  5:41 PM. If you have any questions, ask your nurse or doctor.          amitriptyline 50 MG tablet Commonly known as: ELAVIL Take 1 tablet (50 mg total) by mouth at bedtime.   atorvastatin 40 MG tablet Commonly known as: LIPITOR Take 1 tablet (40 mg total) by mouth daily.   b complex vitamins tablet Take 1 tablet by mouth daily.   D3 + K2 DOTS PO Take by mouth.   EPINEPHrine 0.3 mg/0.3 mL Soaj  injection Commonly known as: Auvi-Q Inject 0.3 mLs (0.3 mg total) into the muscle as needed for anaphylaxis.   famotidine 20 MG tablet Commonly known as: PEPCID Take 20 mg by mouth at bedtime.   FISH OIL PO Take by mouth.   levocetirizine 5 MG tablet Commonly known as: XYZAL TAKE 1 TABLET BY MOUTH EVERY EVENING   levothyroxine 100 MCG tablet Commonly known as: SYNTHROID Take 1 tablet (100 mcg total) by mouth daily before breakfast.   lidocaine 2 % solution Commonly known as: XYLOCAINE Use as directed 15 mLs in the mouth or throat as needed for mouth pain.   magnesium oxide 400 (241.3 Mg) MG tablet Commonly known as: MAG-OX Take 1 tablet (400 mg total) by mouth 2 (two) times daily.   Multi-Vitamin tablet Take by mouth.   omeprazole 20 MG capsule Commonly known as: PRILOSEC TAKE 1 CAPSULE BY MOUTH ONCE A DAY   triamcinolone 0.025 % cream Commonly known as: KENALOG Apply 1 application topically daily as needed.   TURMERIC PO Take by mouth.   VITAMIN C PO Take 1 tablet by mouth daily.        All past medical history, surgical history, allergies, family history, immunizations andmedications were updated in the EMR today and reviewed under the history and medication portions of their EMR.     No results found for this or any previous visit (from the past 2160 hour(s)).  No results found.  ROS 14 pt review of systems performed and negative (unless mentioned in an HPI)  Objective: BP 124/83   Pulse 91   Temp 97.8 F (36.6 C) (Oral)   Ht _0  (1.651 m)   Wt 199 lb (90.3 kg)   LMP 02/11/2015 (Approximate)   SpO2 96%   BMI 33.12 kg/m  Physical Exam Vitals and nursing note reviewed.  Constitutional:      General: She is not in acute distress.    Appearance: Normal appearance. She is not ill-appearing or toxic-appearing.  HENT:     Head: Normocephalic and atraumatic.     Right Ear: Tympanic membrane, ear canal and external ear normal. There is no  impacted cerumen.     Left Ear: Tympanic membrane, ear canal and external ear normal. There is no impacted cerumen.     Nose: No congestion or rhinorrhea.     Mouth/Throat:     Mouth: Mucous membranes are moist.     Pharynx: Oropharynx is clear. No oropharyngeal exudate or posterior oropharyngeal erythema.  Eyes:     General: No scleral icterus.       Right eye: No discharge.  Left eye: No discharge.     Extraocular Movements: Extraocular movements intact.     Conjunctiva/sclera: Conjunctivae normal.     Pupils: Pupils are equal, round, and reactive to light.  Cardiovascular:     Rate and Rhythm: Normal rate and regular rhythm.     Pulses: Normal pulses.     Heart sounds: Normal heart sounds. No murmur heard.    No friction rub. No gallop.  Pulmonary:     Effort: Pulmonary effort is normal. No respiratory distress.     Breath sounds: Normal breath sounds. No stridor. No wheezing, rhonchi or rales.  Chest:     Chest wall: No tenderness.  Abdominal:     General: Abdomen is flat. Bowel sounds are normal. There is no distension.     Palpations: Abdomen is soft. There is no mass.     Tenderness: There is no abdominal tenderness. There is no right CVA tenderness, left CVA tenderness, guarding or rebound.     Hernia: No hernia is present.  Musculoskeletal:        General: No swelling, tenderness or deformity. Normal range of motion.     Cervical back: Normal range of motion and neck supple. No rigidity or tenderness.     Right lower leg: No edema.     Left lower leg: No edema.  Lymphadenopathy:     Cervical: No cervical adenopathy.  Skin:    General: Skin is warm and dry.     Coloration: Skin is not jaundiced or pale.     Findings: No bruising, erythema, lesion or rash.  Neurological:     General: No focal deficit present.     Mental Status: She is alert and oriented to person, place, and time. Mental status is at baseline.     Cranial Nerves: No cranial nerve deficit.      Sensory: No sensory deficit.     Motor: No weakness.     Coordination: Coordination normal.     Gait: Gait normal.     Deep Tendon Reflexes: Reflexes normal.  Psychiatric:        Mood and Affect: Mood normal.        Behavior: Behavior normal.        Thought Content: Thought content normal.        Judgment: Judgment normal.      No results found.  Assessment/plan: LAYIA WALLA is a 51 y.o. female present for CPE/CMC Mixed hyperlipidemia/obesity Has been stable. Continue Lipitor 40 mg daily We discussed Mounjaro, Ozempic and Wegovy today. - Comprehensive metabolic panel - CBC - Lipid panel - TSH - atorvastatin (LIPITOR) 40 MG tablet; Take 1 tablet (40 mg total) by mouth daily.  Dispense: 90 tablet; Refill: 3  Postsurgical hypothyroidism She reports compliance with levothyroxine 100 mcg daily.  Refills will be provided and appropriate dose after results received. - Comprehensive metabolic panel - Lipid panel - TSH - T4, free  Chronic migraine without aura without status migrainosus, not intractable Stable Continue amitriptyline (ELAVIL) 50 MG tablet; Take 1 tablet (50 mg total) by mouth at bedtime.  Dispense: 90 tablet; Refill: 3  Encounter for long-term current use of medication - Comprehensive metabolic panel - CBC - Hemoglobin A1c - Lipid panel - TSH  Breast cancer screening by mammogram - MM 3D SCREEN BREAST BILATERAL; Future   Gastroesophageal reflux disease without esophagitis stable - continue omeprazole (PRILOSEC) 20 MG capsule; TAKE 1 CAPSULE BY MOUTH ONCE A DAY  Dispense: 90 capsule; Refill: 3  Routine general medical examination at a health care facility Colonoscopy: no fhx. Routine screen at 45> due> she has kit at home Mammogram: last mam 03/04/2021> ordered today [facility] Cervical cancer screening: last pap: 2019, has scheduled.  Immunizations: tdap 08/16/2019 , Influenza UTD (encouraged yearly).  Shingrix declined Infectious disease screening:  HIV likely completed with pregnancy. Hep C screen completed. DEXA: routine screen at 60-65 Patient was encouraged to exercise greater than 150 minutes a week. Patient was encouraged to choose a diet filled with fresh fruits and vegetables, and lean meats. AVS provided to patient today for education/recommendation on gender specific health and safety maintenance.  Return in about 1 year (around 04/01/2023) for cpe (20 min).  Orders Placed This Encounter  Procedures   MM 3D SCREEN BREAST BILATERAL   CBC   Comprehensive metabolic panel   Hemoglobin A1c   Lipid panel   T4, free   TSH    Orders Placed This Encounter  Procedures   MM 3D SCREEN BREAST BILATERAL   CBC   Comprehensive metabolic panel   Hemoglobin A1c   Lipid panel   T4, free   TSH   Meds ordered this encounter  Medications   amitriptyline (ELAVIL) 50 MG tablet    Sig: Take 1 tablet (50 mg total) by mouth at bedtime.    Dispense:  90 tablet    Refill:  3   atorvastatin (LIPITOR) 40 MG tablet    Sig: Take 1 tablet (40 mg total) by mouth daily.    Dispense:  90 tablet    Refill:  3   omeprazole (PRILOSEC) 20 MG capsule    Sig: TAKE 1 CAPSULE BY MOUTH ONCE A DAY    Dispense:  90 capsule    Refill:  3   Referral Orders  No referral(s) requested today     Electronically signed by: Howard Pouch, [place]

## 2022-03-31 ENCOUNTER — Telehealth: Payer: Self-pay | Admitting: Family Medicine

## 2022-03-31 ENCOUNTER — Other Ambulatory Visit (HOSPITAL_COMMUNITY): Payer: Self-pay

## 2022-03-31 DIAGNOSIS — E782 Mixed hyperlipidemia: Secondary | ICD-10-CM

## 2022-03-31 LAB — CBC
Hematocrit: 46.4 % (ref 34.0–46.6)
Hemoglobin: 15.9 g/dL (ref 11.1–15.9)
MCH: 31.2 pg (ref 26.6–33.0)
MCHC: 34.3 g/dL (ref 31.5–35.7)
MCV: 91 fL (ref 79–97)
Platelets: 259 10*3/uL (ref 150–450)
RBC: 5.1 x10E6/uL (ref 3.77–5.28)
RDW: 13.1 % (ref 11.7–15.4)
WBC: 8.1 10*3/uL (ref 3.4–10.8)

## 2022-03-31 LAB — LIPID PANEL
Chol/HDL Ratio: 3.4 ratio (ref 0.0–4.4)
Cholesterol, Total: 205 mg/dL — ABNORMAL HIGH (ref 100–199)
HDL: 60 mg/dL (ref >39)
LDL Chol Calc (NIH): 118 mg/dL — ABNORMAL HIGH (ref 0–99)
Triglycerides: 153 mg/dL — ABNORMAL HIGH (ref 0–149)
VLDL Cholesterol Cal: 27 mg/dL (ref 5–40)

## 2022-03-31 LAB — COMPREHENSIVE METABOLIC PANEL
ALT: 49 IU/L — ABNORMAL HIGH (ref 0–32)
AST: 35 IU/L (ref 0–40)
Albumin/Globulin Ratio: 1.9 (ref 1.2–2.2)
Albumin: 5 g/dL — ABNORMAL HIGH (ref 3.9–4.9)
Alkaline Phosphatase: 148 IU/L — ABNORMAL HIGH (ref 44–121)
BUN/Creatinine Ratio: 14 (ref 9–23)
BUN: 12 mg/dL (ref 6–24)
Bilirubin Total: 0.6 mg/dL (ref 0.0–1.2)
CO2: 20 mmol/L (ref 20–29)
Calcium: 9.9 mg/dL (ref 8.7–10.2)
Chloride: 101 mmol/L (ref 96–106)
Creatinine, Ser: 0.87 mg/dL (ref 0.57–1.00)
Globulin, Total: 2.6 g/dL (ref 1.5–4.5)
Glucose: 83 mg/dL (ref 70–99)
Potassium: 4.2 mmol/L (ref 3.5–5.2)
Sodium: 141 mmol/L (ref 134–144)
Total Protein: 7.6 g/dL (ref 6.0–8.5)
eGFR: 81 mL/min/{1.73_m2} (ref >59)

## 2022-03-31 LAB — HEMOGLOBIN A1C
Est. average glucose Bld gHb Est-mCnc: 117 mg/dL
Hgb A1c MFr Bld: 5.7 % — ABNORMAL HIGH (ref 4.8–5.6)

## 2022-03-31 LAB — TSH: TSH: 0.456 u[IU]/mL (ref 0.450–4.500)

## 2022-03-31 LAB — T4, FREE: Free T4: 1.44 ng/dL (ref 0.82–1.77)

## 2022-03-31 MED ORDER — ATORVASTATIN CALCIUM 80 MG PO TABS
80.0000 mg | ORAL_TABLET | Freq: Every day | ORAL | 3 refills | Status: DC
  Filled 2022-03-31: qty 90, 90d supply, fill #0

## 2022-03-31 MED ORDER — LEVOTHYROXINE SODIUM 100 MCG PO TABS
100.0000 ug | ORAL_TABLET | Freq: Every day | ORAL | 3 refills | Status: DC
  Filled 2022-03-31 – 2022-04-22 (×2): qty 90, 90d supply, fill #0
  Filled 2022-07-20: qty 90, 90d supply, fill #1
  Filled 2022-10-13: qty 90, 90d supply, fill #2
  Filled 2023-01-12: qty 90, 90d supply, fill #3

## 2022-03-31 MED ORDER — ATORVASTATIN CALCIUM 40 MG PO TABS
40.0000 mg | ORAL_TABLET | Freq: Every day | ORAL | 3 refills | Status: DC
  Filled 2022-03-31 – 2022-06-21 (×2): qty 90, 90d supply, fill #0
  Filled 2022-09-16: qty 90, 90d supply, fill #1
  Filled 2022-12-21: qty 90, 90d supply, fill #2
  Filled 2023-03-16: qty 90, 90d supply, fill #3

## 2022-03-31 NOTE — Telephone Encounter (Signed)
Please call patient Liver, kidney and thyroid function are normal> I have refilled her thyroid medication at same dose. Blood cell counts and electrolytes are normal She has mildly elevated alkaline phosphatase and ALT which are liver function tests.  This has been chronic and stable overall.  This is possibly is related to amitriptyline side effect, and is not concerning at this level.  We will continue to monitor closely.  Cholesterol panel looks good.  Her triglycerides look mildly elevated however she was not completely fasting.  For this is normal and not concerning.  Continue Lipitor 40 mg daily  Diabetes screening/A1c is the same as prior, but is technically in the prediabetic range at 5.7. She expressed interest in medication for weight loss.  If she is interested in pursuing weight loss counseling, please have her schedule an appointment.  Please advise her to come to that appointment with a food diary for 2 weeks. With her A1c/diabetes screening in the prediabetic range, we should be able to get Ozempic or Mounjaro possibly covered for her.  Weight loss counseling does encompass medications if appropriate, but most importantly dietary, behavioral and exercise modifications will be discussed.    [place]: Please call pharmacy and cancel the atorvastatin 80 mg tabs that were accidentally called in.  I did send in the 40 mg tabs as well and that is the prescription that should be used. Thx!

## 2022-03-31 NOTE — Telephone Encounter (Signed)
Spoke with pt regarding labs and instructions.   

## 2022-03-31 NOTE — Telephone Encounter (Signed)
Called pharmacy and cancelled script.

## 2022-04-15 ENCOUNTER — Encounter: Payer: Self-pay | Admitting: Family Medicine

## 2022-04-15 ENCOUNTER — Ambulatory Visit: Payer: 59 | Admitting: Family Medicine

## 2022-04-15 VITALS — BP 139/76 | HR 85 | Temp 97.3°F | Ht 65.0 in | Wt 199.0 lb

## 2022-04-15 DIAGNOSIS — Z713 Dietary counseling and surveillance: Secondary | ICD-10-CM | POA: Diagnosis not present

## 2022-04-15 DIAGNOSIS — E6609 Other obesity due to excess calories: Secondary | ICD-10-CM | POA: Diagnosis not present

## 2022-04-15 DIAGNOSIS — Z6833 Body mass index (BMI) 33.0-33.9, adult: Secondary | ICD-10-CM | POA: Diagnosis not present

## 2022-04-15 DIAGNOSIS — E782 Mixed hyperlipidemia: Secondary | ICD-10-CM

## 2022-04-15 MED ORDER — WEGOVY 0.25 MG/0.5ML ~~LOC~~ SOAJ
0.2500 mg | SUBCUTANEOUS | 1 refills | Status: DC
  Filled 2022-04-15 – 2022-06-22 (×2): qty 2, 28d supply, fill #0

## 2022-04-15 NOTE — Patient Instructions (Addendum)
-  Weekly net calorie calculator.   Applications for calorie counting.  1200 cal/d ensure she is taking in adequate nutrition daily by meeting calorie goals. dietary changes to not only lose weight but to eat healthy.  Patient was educated on glycemic index. exercise goal of 150 minutes a week (plus warm up and cool down) of cardiovascular exercise.  -Patient was encouraged to maintain adequate water consumption of at least 100 ounces a day, more if exercising/sweating.

## 2022-04-15 NOTE — Progress Notes (Signed)
Leslie Valencia , 04-20-1971, 51 y.o., female MRN: [id number] Patient Care Team    Relationship Specialty Notifications Start End  Ma Hillock, DO PCP - General Family Medicine  04/12/18   Charisse March, MD Referring Physician Obstetrics and Gynecology  04/12/18   Sarina Ser, MD Referring Physician Endocrinology  04/12/18     Chief Complaint  Patient presents with   Obesity    Pt brought food log      Subjective: Leslie C Shultzsis a 51 y.o. female present for weight loss counseling.   Wt 199 ilbs, BMI 33.12 today. We reviewed her food log today which consist of mostly complex carbs and a poppyseed salad from [employer]. She is drinking diet [email or web address] as well as water.  She routinely consumes little over 64 ounces of water a day-sometimes closer to 96 ounces.  She is not routinely exercising.  Exercises about 2 times a week now.  Works long hours.     04/15/2022    3:31 PM 02/26/2021    1:12 PM 08/16/2019   10:04 AM 04/12/2018    9:57 AM  Depression screen PHQ 2/9  Decreased Interest 0 0 0 0  Down, Depressed, Hopeless 0 0 0 0  PHQ - 2 Score 0 0 0 0    Allergies  Allergen Reactions   Morphine And Related Anaphylaxis   Mushroom Extract Complex Anaphylaxis   Norco [Hydrocodone-Acetaminophen] Hives    Stomach pain   Tramadol     Stomach pain    Social History   Social History Narrative   Marital status/children/pets: married, 2 children.    Education/employment: Programmer, applications:      -Wears a bicycle helmet riding a bike: Yes     -smoke alarm in the home:Yes     - wears seatbelt: Yes     - Feels safe in their relationships: Yes   Past Medical History:  Diagnosis Date   Allergy    Asthma    patient denies   Bronchitis    Esophagitis    GERD (gastroesophageal reflux disease)    History of kidney stones    Hyperlipidemia    Hyperthyroidism    Migraine    Multinodular goiter    S/P total thyroidectomy 08/22/2018   Seasonal allergies    Vaginal  delivery    x2   Past Surgical History:  Procedure Laterality Date   CHOLECYSTECTOMY  2005   THYROIDECTOMY  08/22/2018   THYROIDECTOMY N/A 08/22/2018   Procedure: TOTAL THYROIDECTOMY;  Surgeon: Ralene Ok, MD;  Location: [place];  Service: General;  Laterality: N/A;   Family History  Problem Relation Age of Onset   Migraines Mother    Alcohol abuse Father    Arthritis Father    Depression Father    Early death Father    Heart disease Maternal Grandmother    Hyperlipidemia Maternal Grandmother    Allergies as of 04/15/2022       Reactions   Morphine And Related Anaphylaxis   Mushroom Extract Complex Anaphylaxis   Norco [hydrocodone-acetaminophen] Hives   Stomach pain   Tramadol    Stomach pain         Medication List        Accurate as of April 15, 2022  4:45 PM. If you have any questions, ask your nurse or doctor.          amitriptyline 50 MG tablet Commonly known as: ELAVIL Take 1 tablet (  50 mg total) by mouth at bedtime.   atorvastatin 40 MG tablet Commonly known as: LIPITOR Take 1 tablet (40 mg total) by mouth daily.   b complex vitamins tablet Take 1 tablet by mouth daily.   D3 + K2 DOTS PO Take by mouth.   EPINEPHrine 0.3 mg/0.3 mL Soaj injection Commonly known as: Auvi-Q Inject 0.3 mLs (0.3 mg total) into the muscle as needed for anaphylaxis.   famotidine 20 MG tablet Commonly known as: PEPCID Take 20 mg by mouth at bedtime.   FISH OIL PO Take by mouth.   levocetirizine 5 MG tablet Commonly known as: XYZAL TAKE 1 TABLET BY MOUTH EVERY EVENING   levothyroxine 100 MCG tablet Commonly known as: SYNTHROID Take 1 tablet (100 mcg total) by mouth daily before breakfast.   lidocaine 2 % solution Commonly known as: XYLOCAINE Use as directed 15 mLs in the mouth or throat as needed for mouth pain.   magnesium oxide 400 (241.3 Mg) MG tablet Commonly known as: MAG-OX Take 1 tablet (400 mg total) by mouth 2 (two) times daily.    Multi-Vitamin tablet Take by mouth.   omeprazole 20 MG capsule Commonly known as: PRILOSEC TAKE 1 CAPSULE BY MOUTH ONCE A DAY   triamcinolone 0.025 % cream Commonly known as: KENALOG Apply 1 application topically daily as needed.   TURMERIC PO Take by mouth.   VITAMIN C PO Take 1 tablet by mouth daily.   Wegovy 0.25 MG/0.5ML Soaj Generic drug: Semaglutide-Weight Management Inject 0.25 mg into the skin once a week. Started by: Howard Pouch, DO        All past medical history, surgical history, allergies, family history, immunizations andmedications were updated in the EMR today and reviewed under the history and medication portions of their EMR.     ROS Negative, with the exception of above mentioned in HPI   Objective:  BP 139/76   Pulse 85   Temp (!) 97.3 F (36.3 C) (Oral)   Ht '5\' 5"'$  (1.651 m)   Wt 199 lb (90.3 kg)   LMP 02/11/2015 (Approximate)   SpO2 95%   BMI 33.12 kg/m  Body mass index is 33.12 kg/m. Physical Exam Vitals and nursing note reviewed.  Constitutional:      General: She is not in acute distress.    Appearance: Normal appearance. She is obese. She is not ill-appearing or toxic-appearing.  Eyes:     Extraocular Movements: Extraocular movements intact.     Conjunctiva/sclera: Conjunctivae normal.     Pupils: Pupils are equal, round, and reactive to light.  Neurological:     Mental Status: She is alert and oriented to person, place, and time. Mental status is at baseline.  Psychiatric:        Mood and Affect: Mood normal.        Behavior: Behavior normal.        Thought Content: Thought content normal.        Judgment: Judgment normal.    No results found. No results found. No results found for this or any previous visit (from the past 24 hour(s)).  Assessment/Plan: Leslie Valencia is a 51 y.o. female present for OV for  Weight loss counseling, encounter for/Mixed hyperlipidemia/BMI 33.0-33.9,adult/Class 1 obesity due to excess  calories with serious comorbidity and body mass index (BMI) of 33.0 to 33.9 in adult Patient was counseled on exercise, calorie counting, weight loss and potential medications to help with weight loss today. -Patient was provided with online resources  for: Weekly net calorie calculator.  Applications for calorie counting.  Patient was advised to ensure she is taking in adequate nutrition daily by meeting calorie goals. -Patient was educated on dietary changes to not only lose weight but to eat healthy.  Patient was educated on glycemic index. -Patient was educated on exercise goal of 150 minutes a week (plus warm up and cool down) of cardiovascular exercise.  Patient was educated on heart rate for cardiovascular and fat burning zones. -Patient was encouraged to maintain adequate water consumption of at least 120 ounces a day, more if exercising/sweating. Her barrier is going to be surrounding meal prepping and time restraint for exercising. Wegovy 0.25 weekly injection prescribed. Patient to follow-up approximately 3 weeks after starting [facility] so that we can see if she is doing well, get first weight and and taper off all medication if tolerating.  Reviewed expectations re: course of current medical issues. Discussed self-management of symptoms. Outlined signs and symptoms indicating need for more acute intervention. Patient verbalized understanding and all questions were answered. Patient received an After-Visit Summary.    No orders of the defined types were placed in this encounter.  Meds ordered this encounter  Medications   WEGOVY 0.25 MG/0.5ML SOAJ    Sig: Inject 0.25 mg into the skin once a week.    Dispense:  2 mL    Refill:  1   Referral Orders  No referral(s) requested today     Note is dictated utilizing voice recognition software. Although note has been proof read prior to signing, occasional typographical errors still can be missed. If any questions arise, please do not  hesitate to call for verification.   electronically signed by:  Howard Pouch, DO  [place]

## 2022-04-16 ENCOUNTER — Other Ambulatory Visit (HOSPITAL_COMMUNITY): Payer: Self-pay

## 2022-04-19 ENCOUNTER — Encounter: Payer: Self-pay | Admitting: Family Medicine

## 2022-04-21 ENCOUNTER — Other Ambulatory Visit (HOSPITAL_COMMUNITY): Payer: Self-pay

## 2022-04-22 ENCOUNTER — Other Ambulatory Visit (HOSPITAL_COMMUNITY): Payer: Self-pay

## 2022-04-26 ENCOUNTER — Other Ambulatory Visit (HOSPITAL_COMMUNITY): Payer: Self-pay

## 2022-05-05 ENCOUNTER — Ambulatory Visit: Payer: 59

## 2022-05-05 ENCOUNTER — Ambulatory Visit (INDEPENDENT_AMBULATORY_CARE_PROVIDER_SITE_OTHER): Payer: 59

## 2022-05-05 DIAGNOSIS — Z1231 Encounter for screening mammogram for malignant neoplasm of breast: Secondary | ICD-10-CM

## 2022-05-09 ENCOUNTER — Other Ambulatory Visit (HOSPITAL_COMMUNITY): Payer: Self-pay

## 2022-05-17 ENCOUNTER — Other Ambulatory Visit (HOSPITAL_COMMUNITY): Payer: Self-pay

## 2022-06-02 ENCOUNTER — Other Ambulatory Visit (HOSPITAL_COMMUNITY): Payer: Self-pay

## 2022-06-21 ENCOUNTER — Other Ambulatory Visit (HOSPITAL_COMMUNITY): Payer: Self-pay

## 2022-06-21 ENCOUNTER — Encounter: Payer: Self-pay | Admitting: Family Medicine

## 2022-06-22 ENCOUNTER — Other Ambulatory Visit (HOSPITAL_COMMUNITY): Payer: Self-pay

## 2022-07-20 ENCOUNTER — Other Ambulatory Visit (HOSPITAL_COMMUNITY): Payer: Self-pay

## 2022-07-29 ENCOUNTER — Telehealth: Payer: Self-pay | Admitting: Allergy & Immunology

## 2022-07-29 MED ORDER — NIRMATRELVIR/RITONAVIR (PAXLOVID)TABLET: 3.0000 | ORAL_TABLET | Freq: Two times a day (BID) | ORAL | 0 refills | Status: AC

## 2022-07-29 NOTE — Telephone Encounter (Signed)
Patient called and is COVID positive. I am sending in Paxlovid. Renal function within normal limits.   Salvatore Marvel, MD [facility]

## 2022-08-01 MED ORDER — LIDOCAINE VISCOUS HCL 2 % MT SOLN: 15.0000 mL | OROMUCOSAL | 2 refills | Status: DC | PRN

## 2022-08-01 MED ORDER — ONDANSETRON 4 MG PO TBDP: 4.0000 mg | ORAL_TABLET | Freq: Three times a day (TID) | ORAL | 1 refills | Status: DC | PRN

## 2022-08-01 NOTE — Addendum Note (Signed)
Addended by: Valentina Shaggy on: 08/01/2022 04:05 PM   Modules accepted: Orders

## 2022-08-01 NOTE — Addendum Note (Signed)
Addended by: Valentina Shaggy on: 08/01/2022 04:22 PM   Modules accepted: Orders

## 2022-08-09 MED ORDER — AMOXICILLIN-POT CLAVULANATE 875-125 MG PO TABS: 1.0000 | ORAL_TABLET | Freq: Two times a day (BID) | ORAL | 0 refills | Status: AC

## 2022-08-09 NOTE — Addendum Note (Signed)
Addended by: Valentina Shaggy on: 08/09/2022 08:28 AM   Modules accepted: Orders

## 2022-08-09 NOTE — Telephone Encounter (Signed)
Patient appears to have a secondary bacterial sinus infection. She was improving for a couple of days before the sinus pain and pressure worsened. I am sending in Augmentin BID for ten days. Patient in agreement with the plan.

## 2022-09-16 ENCOUNTER — Other Ambulatory Visit (HOSPITAL_COMMUNITY): Payer: Self-pay

## 2022-09-16 ENCOUNTER — Other Ambulatory Visit: Payer: Self-pay | Admitting: Family Medicine

## 2022-09-16 ENCOUNTER — Other Ambulatory Visit: Payer: Self-pay | Admitting: Allergy & Immunology

## 2022-09-16 ENCOUNTER — Other Ambulatory Visit: Payer: Self-pay

## 2022-09-16 MED ORDER — LEVOCETIRIZINE DIHYDROCHLORIDE 5 MG PO TABS
5.0000 mg | ORAL_TABLET | Freq: Every evening | ORAL | 1 refills | Status: DC
  Filled 2022-09-16 – 2022-09-17 (×2): qty 90, 90d supply, fill #0
  Filled 2022-12-21: qty 90, 90d supply, fill #1

## 2022-09-16 MED ORDER — FAMOTIDINE 20 MG PO TABS
20.0000 mg | ORAL_TABLET | Freq: Every day | ORAL | 3 refills | Status: DC
  Filled 2022-09-16: qty 90, 90d supply, fill #0
  Filled 2022-12-21: qty 90, 90d supply, fill #1
  Filled 2023-03-16: qty 90, 90d supply, fill #2
  Filled 2023-05-31: qty 90, 90d supply, fill #3

## 2022-09-16 NOTE — Addendum Note (Signed)
Addended by: Valentina Shaggy on: 09/16/2022 01:05 PM   Modules accepted: Orders

## 2022-09-17 ENCOUNTER — Other Ambulatory Visit (HOSPITAL_COMMUNITY): Payer: Self-pay

## 2022-10-13 ENCOUNTER — Other Ambulatory Visit: Payer: Self-pay

## 2022-10-13 ENCOUNTER — Other Ambulatory Visit (HOSPITAL_COMMUNITY): Payer: Self-pay

## 2022-10-25 ENCOUNTER — Other Ambulatory Visit (HOSPITAL_COMMUNITY): Payer: Self-pay

## 2022-10-27 ENCOUNTER — Encounter: Payer: Self-pay | Admitting: Family Medicine

## 2022-11-15 ENCOUNTER — Encounter: Payer: Self-pay | Admitting: Family Medicine

## 2022-11-15 ENCOUNTER — Other Ambulatory Visit (HOSPITAL_COMMUNITY): Payer: Self-pay

## 2022-11-15 ENCOUNTER — Other Ambulatory Visit: Payer: Self-pay

## 2022-11-15 ENCOUNTER — Ambulatory Visit (INDEPENDENT_AMBULATORY_CARE_PROVIDER_SITE_OTHER): Payer: Commercial Managed Care - PPO | Admitting: Family Medicine

## 2022-11-15 VITALS — BP 130/84 | HR 89 | Temp 98.3°F | Ht 66.0 in | Wt 205.6 lb

## 2022-11-15 DIAGNOSIS — E6609 Other obesity due to excess calories: Secondary | ICD-10-CM | POA: Diagnosis not present

## 2022-11-15 DIAGNOSIS — Z6833 Body mass index (BMI) 33.0-33.9, adult: Secondary | ICD-10-CM | POA: Diagnosis not present

## 2022-11-15 MED ORDER — SEMAGLUTIDE(0.25 OR 0.5MG/DOS) 2 MG/3ML ~~LOC~~ SOPN: 0.5000 mg | PEN_INJECTOR | SUBCUTANEOUS | 0 refills | Status: DC

## 2022-11-15 MED ORDER — WEGOVY 1 MG/0.5ML ~~LOC~~ SOAJ
1.0000 mg | SUBCUTANEOUS | 0 refills | Status: DC
  Filled 2022-11-15: qty 2, 28d supply, fill #0
  Filled 2022-11-15: qty 3, 28d supply, fill #0
  Filled 2022-11-16 – 2022-11-23 (×4): qty 2, 28d supply, fill #0
  Filled 2022-12-21: qty 2, 28d supply, fill #1

## 2022-11-15 NOTE — Progress Notes (Signed)
Leslie Valencia , October 22, 1970, 52 y.o., female MRN: [id number] Patient Care Team    Relationship Specialty Notifications Start End  Ma Hillock, DO PCP - General Family Medicine  04/12/18   Charisse March, MD Referring Physician Obstetrics and Gynecology  04/12/18   Sarina Ser, MD Referring Physician Endocrinology  04/12/18     Chief Complaint  Patient presents with   Obesity     Subjective: Leslie C Shultzsis a 52 y.o. female present for weight loss counseling.   Wt 199 Lbs> 205 BMI 33.12 > 33.18 Diet change: started- not committed yet. tolerating wegovy.   Prior note: We reviewed her food log today which consist of mostly complex carbs and a poppyseed salad from [place]. She is drinking diet [email or web address] as well as water.  She routinely consumes little over 64 ounces of water a day-sometimes closer to 96 ounces.  She is not routinely exercising.  Exercises about 2 times a week now.  Works long hours.     04/15/2022    3:31 PM 02/26/2021    1:12 PM 08/16/2019   10:04 AM 04/12/2018    9:57 AM  Depression screen PHQ 2/9  Decreased Interest 0 0 0 0  Down, Depressed, Hopeless 0 0 0 0  PHQ - 2 Score 0 0 0 0    Allergies  Allergen Reactions   Morphine And Related Anaphylaxis   Mushroom Extract Complex Anaphylaxis   Norco [Hydrocodone-Acetaminophen] Hives    Stomach pain   Tramadol     Stomach pain    Social History   Social History Narrative   Marital status/children/pets: married, 2 children.    Education/employment: Programmer, applications:      -Wears a bicycle helmet riding a bike: Yes     -smoke alarm in the home:Yes     - wears seatbelt: Yes     - Feels safe in their relationships: Yes   Past Medical History:  Diagnosis Date   Allergy    Asthma    patient denies   Bronchitis    Esophagitis    GERD (gastroesophageal reflux disease)    History of kidney stones    Hyperlipidemia    Hyperthyroidism    Migraine    Multinodular goiter    S/P total  thyroidectomy 08/22/2018   Seasonal allergies    Vaginal delivery    x2   Past Surgical History:  Procedure Laterality Date   CHOLECYSTECTOMY  2005   THYROIDECTOMY  08/22/2018   THYROIDECTOMY N/A 08/22/2018   Procedure: TOTAL THYROIDECTOMY;  Surgeon: Ralene Ok, MD;  Location: [facility];  Service: General;  Laterality: N/A;   Family History  Problem Relation Age of Onset   Migraines Mother    Alcohol abuse Father    Arthritis Father    Depression Father    Early death Father    Heart disease Maternal Grandmother    Hyperlipidemia Maternal Grandmother    Allergies as of 11/15/2022       Reactions   Morphine And Related Anaphylaxis   Mushroom Extract Complex Anaphylaxis   Norco [hydrocodone-acetaminophen] Hives   Stomach pain   Tramadol    Stomach pain         Medication List        Accurate as of November 15, 2022  3:37 PM. If you have any questions, ask your nurse or doctor.          STOP taking these medications  lidocaine 2 % solution Commonly known as: XYLOCAINE Stopped by: Howard Pouch, DO   ondansetron 4 MG disintegrating tablet Commonly known as: ZOFRAN-ODT Stopped by: Howard Pouch, DO   triamcinolone 0.025 % cream Commonly known as: KENALOG Stopped by: Howard Pouch, DO   Wegovy 0.25 MG/0.5ML Soaj Generic drug: Semaglutide-Weight Management Replaced by: Wegovy 1 MG/0.5ML Soaj Stopped by: Howard Pouch, DO       TAKE these medications    amitriptyline 50 MG tablet Commonly known as: ELAVIL Take 1 tablet (50 mg total) by mouth at bedtime.   atorvastatin 40 MG tablet Commonly known as: LIPITOR Take 1 tablet (40 mg total) by mouth daily.   b complex vitamins tablet Take 1 tablet by mouth daily.   D3 + K2 DOTS PO Take by mouth.   EPINEPHrine 0.3 mg/0.3 mL Soaj injection Commonly known as: Auvi-Q Inject 0.3 mLs (0.3 mg total) into the muscle as needed for anaphylaxis.   famotidine 20 MG tablet Commonly known as: PEPCID Take 1  tablet (20 mg total) by mouth daily.   FISH OIL PO Take by mouth.   levocetirizine 5 MG tablet Commonly known as: XYZAL Take 1 tablet (5 mg total) by mouth every evening.   levothyroxine 100 MCG tablet Commonly known as: SYNTHROID Take 1 tablet (100 mcg total) by mouth daily before breakfast.   magnesium oxide 400 (241.3 Mg) MG tablet Commonly known as: MAG-OX Take 1 tablet (400 mg total) by mouth 2 (two) times daily.   Multi-Vitamin tablet Take by mouth.   omeprazole 20 MG capsule Commonly known as: PRILOSEC TAKE 1 CAPSULE BY MOUTH ONCE A DAY   Spikevax injection Generic drug: COVID-19 mRNA vaccine (Moderna, >/= 30yr)   TURMERIC PO Take by mouth.   VITAMIN C PO Take 1 tablet by mouth daily.   Wegovy 1 MG/0.5ML Soaj Generic drug: Semaglutide-Weight Management Inject 1 mg into the skin once a week. Replaces: Wegovy 0.25 MG/0.5ML Soaj Started by: RHoward Pouch DO        All past medical history, surgical history, allergies, family history, immunizations andmedications were updated in the EMR today and reviewed under the history and medication portions of their EMR.     ROS Negative, with the exception of above mentioned in HPI   Objective:  BP 130/84   Pulse 89   Temp 98.3 F (36.8 C)   Ht '5\' 6"'$  (1.676 m)   Wt 205 lb 9.6 oz (93.3 kg)   LMP 02/11/2015 (Approximate)   SpO2 98%   BMI 33.18 kg/m  Body mass index is 33.18 kg/m. Physical Exam Vitals and nursing note reviewed.  Constitutional:      General: She is not in acute distress.    Appearance: Normal appearance. She is obese. She is not ill-appearing or toxic-appearing.  Eyes:     Extraocular Movements: Extraocular movements intact.     Conjunctiva/sclera: Conjunctivae normal.     Pupils: Pupils are equal, round, and reactive to light.  Neurological:     Mental Status: She is alert and oriented to person, place, and time. Mental status is at baseline.  Psychiatric:        Mood and Affect: Mood  normal.        Behavior: Behavior normal.        Thought Content: Thought content normal.        Judgment: Judgment normal.    No results found. No results found. No results found for this or any previous visit (from the past  24 hour(s)).  Assessment/Plan: [name] is a 52 y.o. female present for OV for  Weight loss counseling, encounter for/Mixed hyperlipidemia/BMI 33.0-33.9,adult/Class 1 obesity due to excess calories with serious comorbidity and body mass index (BMI) of 33.0 to 33.9 in adult Patient was counseled on exercise, calorie counting, weight loss and potential medications to help with weight loss today. -Patient was provided with online resources for: Weekly net calorie calculator.  Applications for calorie counting.  Patient was advised to ensure she is taking in adequate nutrition daily by meeting calorie goals. -Patient was educated on dietary changes to not only lose weight but to eat healthy.  Patient was educated on glycemic index. -Patient was educated on exercise goal of 150 minutes a week (plus warm up and cool down) of cardiovascular exercise.  Patient was educated on heart rate for cardiovascular and fat burning zones. -Patient was encouraged to maintain adequate water consumption of at least 120 ounces a day, more if exercising/sweating. Her barrier is going to be surrounding meal prepping and time restraint for exercising. Wegovy 0.5 weekly injection provided and 1 mg dose prescribed x3 F/u 6 weeks.    Reviewed expectations re: course of current medical issues. Discussed self-management of symptoms. Outlined signs and symptoms indicating need for more acute intervention. Patient verbalized understanding and all questions were answered. Patient received an After-Visit Summary.    No orders of the defined types were placed in this encounter.  Meds ordered this encounter  Medications   Semaglutide-Weight Management (WEGOVY) 1 MG/0.5ML SOAJ    Sig:  Inject 1 mg into the skin once a week.    Dispense:  6 mL    Refill:  0    Pt supplied with sample of mid dose- this dose is appropriate   Referral Orders  No referral(s) requested today     Note is dictated utilizing voice recognition software. Although note has been proof read prior to signing, occasional typographical errors still can be missed. If any questions arise, please do not hesitate to call for verification.   electronically signed by:  Howard Pouch, DO  [place]

## 2022-11-15 NOTE — Patient Instructions (Addendum)
Return in about 6 weeks (around 12/27/2022) for weight check in.        Great to see you today.  I have refilled the medication(s) we provide.   If labs were collected, we will inform you of lab results once received either by echart message or telephone call.   - echart message- for normal results that have been seen by the patient already.   - telephone call: abnormal results or if patient has not viewed results in their echart.

## 2022-11-16 ENCOUNTER — Other Ambulatory Visit: Payer: Self-pay

## 2022-11-16 ENCOUNTER — Other Ambulatory Visit (HOSPITAL_COMMUNITY): Payer: Self-pay

## 2022-11-17 ENCOUNTER — Other Ambulatory Visit: Payer: Self-pay

## 2022-11-18 ENCOUNTER — Other Ambulatory Visit: Payer: Self-pay

## 2022-11-23 ENCOUNTER — Other Ambulatory Visit (HOSPITAL_BASED_OUTPATIENT_CLINIC_OR_DEPARTMENT_OTHER): Payer: Self-pay

## 2022-11-23 ENCOUNTER — Other Ambulatory Visit (HOSPITAL_COMMUNITY): Payer: Self-pay

## 2022-11-23 ENCOUNTER — Telehealth: Payer: Self-pay

## 2022-11-23 ENCOUNTER — Other Ambulatory Visit: Payer: Self-pay

## 2022-11-29 ENCOUNTER — Encounter: Payer: Self-pay | Admitting: Family Medicine

## 2022-11-29 ENCOUNTER — Other Ambulatory Visit (HOSPITAL_COMMUNITY): Payer: Self-pay

## 2022-11-29 ENCOUNTER — Telehealth (INDEPENDENT_AMBULATORY_CARE_PROVIDER_SITE_OTHER): Payer: Commercial Managed Care - PPO | Admitting: Family Medicine

## 2022-11-29 VITALS — BP 118/82 | HR 75

## 2022-11-29 DIAGNOSIS — R42 Dizziness and giddiness: Secondary | ICD-10-CM | POA: Diagnosis not present

## 2022-11-29 DIAGNOSIS — R112 Nausea with vomiting, unspecified: Secondary | ICD-10-CM

## 2022-11-29 NOTE — Progress Notes (Signed)
VIRTUAL VISIT VIA VIDEO  I connected with Leslie Valencia on 11/29/22 at 11:40 AM EDT by a video enabled telemedicine application and verified that I am speaking with the correct person using two identifiers. Location patient: Home Location provider: [facility], Office Persons participating in the virtual visit: Patient, Dr. Raoul Pitch and Marlou Porch, CMA  I discussed the limitations of evaluation and management by telemedicine and the availability of in person appointments. The patient expressed understanding and agreed to proceed.     Leslie Valencia , 1971-05-16, 52 y.o., female MRN: [id number] Patient Care Team    Relationship Specialty Notifications Start End  Ma Hillock, DO PCP - General Family Medicine  04/12/18   Charisse March, MD Referring Physician Obstetrics and Gynecology  04/12/18   Sarina Ser, MD Referring Physician Endocrinology  04/12/18     Chief Complaint  Patient presents with   Dizziness    Started Tuesday; vomiting; lasted 2 days; light headed all week; dizziness on motion now. Blood pressure does not change while laying down.     Subjective: Leslie Valencia is a 52 y.o. Pt presents for an OV with complaints of dizzy episodes.  Associated symptoms include nausea and vomit. She reports last Tuesday she woke up in the morning and vomited her dinner from the night prior. She and her family had gone out to eat for her Bday. They all had different meals. She was the only person that became ill. She denies fever chills or diarrhea.  She has become dizzy on a few occasions daily, especially when bending over. She has tried drinking Pedialyte. She has started ozempic and is on her 6th inject (3rd inj of 0.5 mg is due tomorrow). She has tolerated the doses without side effects.  She has seen mild improvement today.  Pt has tried antivert to ease their symptoms.      11/29/2022    1:06 PM 04/15/2022    3:31 PM 02/26/2021    1:12 PM 08/16/2019    10:04 AM 04/12/2018    9:57 AM  Depression screen PHQ 2/9  Decreased Interest 0 0 0 0 0  Down, Depressed, Hopeless 0 0 0 0 0  PHQ - 2 Score 0 0 0 0 0    Allergies  Allergen Reactions   Morphine And Related Anaphylaxis   Mushroom Extract Complex Anaphylaxis   Norco [Hydrocodone-Acetaminophen] Hives    Stomach pain   Tramadol     Stomach pain    Social History   Social History Narrative   Marital status/children/pets: married, 2 children.    Education/employment: Programmer, applications:      -Wears a bicycle helmet riding a bike: Yes     -smoke alarm in the home:Yes     - wears seatbelt: Yes     - Feels safe in their relationships: Yes   Past Medical History:  Diagnosis Date   Allergy    Asthma    patient denies   Bronchitis    Esophagitis    GERD (gastroesophageal reflux disease)    History of kidney stones    Hyperlipidemia    Hyperthyroidism    Migraine    Multinodular goiter    S/P total thyroidectomy 08/22/2018   Seasonal allergies    Vaginal delivery    x2   Past Surgical History:  Procedure Laterality Date   CHOLECYSTECTOMY  2005   THYROIDECTOMY  08/22/2018   THYROIDECTOMY N/A 08/22/2018  Procedure: TOTAL THYROIDECTOMY;  Surgeon: Ralene Ok, MD;  Location: [facility] OR;  Service: General;  Laterality: N/A;   Family History  Problem Relation Age of Onset   Migraines Mother    Alcohol abuse Father    Arthritis Father    Depression Father    Early death Father    Heart disease Maternal Grandmother    Hyperlipidemia Maternal Grandmother    Allergies as of 11/29/2022       Reactions   Morphine And Related Anaphylaxis   Mushroom Extract Complex Anaphylaxis   Norco [hydrocodone-acetaminophen] Hives   Stomach pain   Tramadol    Stomach pain         Medication List        Accurate as of November 29, 2022  1:45 PM. If you have any questions, ask your nurse or doctor.          amitriptyline 50 MG tablet Commonly known as: ELAVIL Take 1 tablet (50 mg  total) by mouth at bedtime.   atorvastatin 40 MG tablet Commonly known as: LIPITOR Take 1 tablet (40 mg total) by mouth daily.   b complex vitamins tablet Take 1 tablet by mouth daily.   D3 + K2 DOTS PO Take by mouth.   EPINEPHrine 0.3 mg/0.3 mL Soaj injection Commonly known as: Auvi-Q Inject 0.3 mLs (0.3 mg total) into the muscle as needed for anaphylaxis.   famotidine 20 MG tablet Commonly known as: PEPCID Take 1 tablet (20 mg total) by mouth daily.   FISH OIL PO Take by mouth.   levocetirizine 5 MG tablet Commonly known as: XYZAL Take 1 tablet (5 mg total) by mouth every evening.   levothyroxine 100 MCG tablet Commonly known as: SYNTHROID Take 1 tablet (100 mcg total) by mouth daily before breakfast.   magnesium oxide 400 (241.3 Mg) MG tablet Commonly known as: MAG-OX Take 1 tablet (400 mg total) by mouth 2 (two) times daily.   Multi-Vitamin tablet Take by mouth.   omeprazole 20 MG capsule Commonly known as: PRILOSEC TAKE 1 CAPSULE BY MOUTH ONCE A DAY   Semaglutide(0.25 or 0.5MG /DOS) 2 MG/3ML Sopn Inject 0.5 mg into the skin once a week. Lot # [id number]   Spikevax injection Generic drug: COVID-19 mRNA vaccine (Moderna, >/= 60yrs)   TURMERIC PO Take by mouth.   VITAMIN C PO Take 1 tablet by mouth daily.   Wegovy 1 MG/0.5ML Soaj Generic drug: Semaglutide-Weight Management Inject 1 mg into the skin once a week.        All past medical history, surgical history, allergies, family history, immunizations andmedications were updated in the EMR today and reviewed under the history and medication portions of their EMR.     ROS Negative, with the exception of above mentioned in HPI   Objective:  BP 118/82   Pulse 75   LMP 02/11/2015 (Approximate)  There is no height or weight on file to calculate BMI.  Physical Exam Vitals and nursing note reviewed.  Constitutional:      General: She is not in acute distress.    Appearance: Normal appearance. She  is not toxic-appearing.  HENT:     Head: Normocephalic and atraumatic.  Eyes:     General: No scleral icterus.       Right eye: No discharge.        Left eye: No discharge.     Conjunctiva/sclera: Conjunctivae normal.  Pulmonary:     Effort: Pulmonary effort is normal.  Musculoskeletal:  Cervical back: Normal range of motion.  Skin:    Findings: No rash.  Neurological:     Mental Status: She is alert and oriented to person, place, and time. Mental status is at baseline.  Psychiatric:        Mood and Affect: Mood normal.        Behavior: Behavior normal.        Thought Content: Thought content normal.        Judgment: Judgment normal.      No results found. No results found. No results found for this or any previous visit (from the past 24 hour(s)).  Assessment/Plan: RADWA FELLINGER is a 52 y.o. female present for OV for  Dizziness/Nausea and vomiting, unspecified vomiting type Possibly food borne vs viral etiology She declined zofran and has antivert script Do not believe this related to her ozempic since she has tolerated doses and this particular case seemed to be rather abrupt onset - Basic Metabolic Panel (BMET); Future> if sx are not improving tomorrow evening she will call in to schedule lab appt only on Thursday.  If sx worsen would need to see in person.    Reviewed expectations re: course of current medical issues. Discussed self-management of symptoms. Outlined signs and symptoms indicating need for more acute intervention. Patient verbalized understanding and all questions were answered. Patient received an After-Visit Summary.    Orders Placed This Encounter  Procedures   Basic Metabolic Panel (BMET)   No orders of the defined types were placed in this encounter.  Referral Orders  No referral(s) requested today     Note is dictated utilizing voice recognition software. Although note has been proof read prior to signing, occasional  typographical errors still can be missed. If any questions arise, please do not hesitate to call for verification.   electronically signed by:  Howard Pouch, DO  [place]

## 2022-11-29 NOTE — Telephone Encounter (Signed)
noted 

## 2022-11-29 NOTE — Telephone Encounter (Signed)
Pharmacy Patient Advocate Encounter   Received notification that prior authorization for Wegovy 1mg /0.10ml is required/requested.  Per Test Claim: Prior authorization required   PA submitted on 11/29/22 to (ins) [employer] via CoverMyMeds Key #  [id number] Status is pending

## 2022-12-01 ENCOUNTER — Other Ambulatory Visit: Payer: Self-pay

## 2022-12-02 ENCOUNTER — Other Ambulatory Visit: Payer: Self-pay

## 2022-12-07 ENCOUNTER — Encounter: Payer: Self-pay | Admitting: Family Medicine

## 2022-12-07 ENCOUNTER — Other Ambulatory Visit (HOSPITAL_COMMUNITY): Payer: Self-pay

## 2022-12-08 NOTE — Telephone Encounter (Signed)
Pharmacy Patient Advocate Encounter  Prior Authorization for Mancel Parsons has been approved by [employer] (ins).    PA # [id number] Effective dates: 12/01/2022 through 05/25/2023

## 2022-12-08 NOTE — Telephone Encounter (Signed)
Wrote the prescription to be in day supply on purpose.  However, it is up to the insurance/pharmacy to approve a 90-day supply. Hopefully they will be able to get at least 1 more refill and, again this can be up to her insurance approving it.  We will have to wait and see the outcome before I can respond to her question about next move

## 2022-12-12 NOTE — Telephone Encounter (Signed)
noted 

## 2022-12-21 ENCOUNTER — Other Ambulatory Visit (HOSPITAL_COMMUNITY): Payer: Self-pay

## 2022-12-26 ENCOUNTER — Other Ambulatory Visit: Payer: Self-pay

## 2023-01-04 DIAGNOSIS — Z1211 Encounter for screening for malignant neoplasm of colon: Secondary | ICD-10-CM | POA: Diagnosis not present

## 2023-01-12 ENCOUNTER — Other Ambulatory Visit (HOSPITAL_COMMUNITY): Payer: Self-pay

## 2023-01-12 LAB — COLOGUARD: COLOGUARD: NEGATIVE

## 2023-01-12 NOTE — Telephone Encounter (Signed)
No further action needed.

## 2023-01-24 ENCOUNTER — Other Ambulatory Visit (HOSPITAL_COMMUNITY): Payer: Self-pay

## 2023-01-24 ENCOUNTER — Ambulatory Visit: Payer: Commercial Managed Care - PPO | Admitting: Family Medicine

## 2023-01-24 ENCOUNTER — Other Ambulatory Visit: Payer: Self-pay

## 2023-01-24 ENCOUNTER — Encounter: Payer: Self-pay | Admitting: Family Medicine

## 2023-01-24 VITALS — BP 133/86 | HR 84 | Temp 97.7°F | Wt 196.8 lb

## 2023-01-24 DIAGNOSIS — E6609 Other obesity due to excess calories: Secondary | ICD-10-CM

## 2023-01-24 DIAGNOSIS — Z6833 Body mass index (BMI) 33.0-33.9, adult: Secondary | ICD-10-CM

## 2023-01-24 DIAGNOSIS — E782 Mixed hyperlipidemia: Secondary | ICD-10-CM | POA: Diagnosis not present

## 2023-01-24 DIAGNOSIS — Z713 Dietary counseling and surveillance: Secondary | ICD-10-CM | POA: Diagnosis not present

## 2023-01-24 MED ORDER — RIZATRIPTAN BENZOATE 10 MG PO TABS
10.0000 mg | ORAL_TABLET | ORAL | 5 refills | Status: DC | PRN
  Filled 2023-01-24: qty 10, 30d supply, fill #0

## 2023-01-24 MED ORDER — WEGOVY 1.7 MG/0.75ML ~~LOC~~ SOAJ
1.7000 mg | SUBCUTANEOUS | 5 refills | Status: DC
  Filled 2023-01-24: qty 3, fill #0
  Filled 2023-01-25 – 2023-02-01 (×2): qty 3, 28d supply, fill #0

## 2023-01-24 NOTE — Patient Instructions (Signed)
No follow-ups on file.        Great to see you today.  I have refilled the medication(s) we provide.   If labs were collected, we will inform you of lab results once received either by echart message or telephone call.   - echart message- for normal results that have been seen by the patient already.   - telephone call: abnormal results or if patient has not viewed results in their echart.  

## 2023-01-24 NOTE — Progress Notes (Signed)
Leslie Valencia , 08-22-1971, 52 y.o., female MRN: [id number] Patient Care Team    Relationship Specialty Notifications Start End  Natalia Leatherwood, DO PCP - General Family Medicine  04/12/18   Venita Sheffield, MD Referring Physician Obstetrics and Gynecology  04/12/18   Teodoro Spray, MD Referring Physician Endocrinology  04/12/18     Chief Complaint  Patient presents with   Weight Check     Subjective: Leslie Metze Shultzsis a 52 y.o. female present for weight loss counseling.   Wt 199 Lbs> 205>196.8 BMI 33.12 > 33.18>31.76 tolerating wegovy 1 mg weekly.  Exercising, walking, standing desk, cut back on diet dew (did not quit).  Drinking water at least 100 ounces a day. Changed diet and making smarter choices, smaller portions. Prior note: We reviewed her food log today which consist of mostly complex carbs and a poppyseed salad from [employer]. She is drinking diet [email or web address]-evans.[email or web address] as well as water.  She routinely consumes little over 64 ounces of water a day-sometimes closer to 96 ounces.  She is not routinely exercising.  Exercises about 2 times a week now.  Works long hours.     11/29/2022    1:06 PM 04/15/2022    3:31 PM 02/26/2021    1:12 PM 08/16/2019   10:04 AM 04/12/2018    9:57 AM  Depression screen PHQ 2/9  Decreased Interest 0 0 0 0 0  Down, Depressed, Hopeless 0 0 0 0 0  PHQ - 2 Score 0 0 0 0 0    Allergies  Allergen Reactions   Morphine And Related Anaphylaxis   Mushroom Extract Complex Anaphylaxis   Norco [Hydrocodone-Acetaminophen] Hives    Stomach pain   Tramadol     Stomach pain    Social History   Social History Narrative   Marital status/children/pets: married, 2 children.    Education/employment: Producer, television/film/video:      -Wears a bicycle helmet riding a bike: Yes     -smoke alarm in the home:Yes     - wears seatbelt: Yes     - Feels safe in their relationships: Yes   Past Medical History:  Diagnosis Date   Allergy    Asthma    patient denies    Bronchitis    Esophagitis    GERD (gastroesophageal reflux disease)    History of kidney stones    Hyperlipidemia    Hyperthyroidism    Migraine    Multinodular goiter    S/P total thyroidectomy 08/22/2018   Seasonal allergies    Vaginal delivery    x2   Past Surgical History:  Procedure Laterality Date   CHOLECYSTECTOMY  2005   THYROIDECTOMY  08/22/2018   THYROIDECTOMY N/A 08/22/2018   Procedure: TOTAL THYROIDECTOMY;  Surgeon: Axel Filler, MD;  Location: [facility] OR;  Service: General;  Laterality: N/A;   Family History  Problem Relation Age of Onset   Migraines Mother    Alcohol abuse Father    Arthritis Father    Depression Father    Early death Father    Heart disease Maternal Grandmother    Hyperlipidemia Maternal Grandmother    Allergies as of 01/24/2023       Reactions   Morphine And Related Anaphylaxis   Mushroom Extract Complex Anaphylaxis   Norco [hydrocodone-acetaminophen] Hives   Stomach pain   Tramadol    Stomach pain         Medication List  Accurate as of Jan 24, 2023  2:06 PM. If you have any questions, ask your nurse or doctor.          STOP taking these medications    Semaglutide(0.25 or 0.5MG /DOS) 2 MG/3ML Sopn Stopped by: Felix Pacini, DO       TAKE these medications    amitriptyline 50 MG tablet Commonly known as: ELAVIL Take 1 tablet (50 mg total) by mouth at bedtime.   atorvastatin 40 MG tablet Commonly known as: LIPITOR Take 1 tablet (40 mg total) by mouth daily.   b complex vitamins tablet Take 1 tablet by mouth daily.   D3 + K2 DOTS PO Take by mouth.   EPINEPHrine 0.3 mg/0.3 mL Soaj injection Commonly known as: Auvi-Q Inject 0.3 mLs (0.3 mg total) into the muscle as needed for anaphylaxis.   famotidine 20 MG tablet Commonly known as: PEPCID Take 1 tablet (20 mg total) by mouth daily.   FISH OIL PO Take by mouth.   levocetirizine 5 MG tablet Commonly known as: XYZAL Take 1 tablet (5 mg total) by  mouth every evening.   levothyroxine 100 MCG tablet Commonly known as: SYNTHROID Take 1 tablet (100 mcg total) by mouth daily before breakfast.   magnesium oxide 400 (241.3 Mg) MG tablet Commonly known as: MAG-OX Take 1 tablet (400 mg total) by mouth 2 (two) times daily.   Multi-Vitamin tablet Take by mouth.   omeprazole 20 MG capsule Commonly known as: PRILOSEC TAKE 1 CAPSULE BY MOUTH ONCE A DAY   rizatriptan 10 MG tablet Commonly known as: Maxalt Take 1 tablet (10 mg total) by mouth as needed for migraine. May repeat in 2 hours if needed Started by: Felix Pacini, DO   Spikevax injection Generic drug: COVID-19 mRNA vaccine (Moderna, >/= 83yrs)   TURMERIC PO Take by mouth.   VITAMIN C PO Take 1 tablet by mouth daily.   Wegovy 1 MG/0.5ML Soaj Generic drug: Semaglutide-Weight Management Inject 1 mg into the skin once a week. What changed: Another medication with the same name was added. Make sure you understand how and when to take each. Changed by: Felix Pacini, DO   Wegovy 1.7 MG/0.75ML Soaj Generic drug: Semaglutide-Weight Management Inject 1.7 mg into the skin once a week. What changed: You were already taking a medication with the same name, and this prescription was added. Make sure you understand how and when to take each. Changed by: Felix Pacini, DO        All past medical history, surgical history, allergies, family history, immunizations andmedications were updated in the EMR today and reviewed under the history and medication portions of their EMR.     ROS Negative, with the exception of above mentioned in HPI   Objective:  BP 133/86   Pulse 84   Temp 97.7 F (36.5 C)   Wt 196 lb 12.8 oz (89.3 kg)   LMP 02/11/2015 (Approximate)   SpO2 100%   BMI 31.76 kg/m  Body mass index is 31.76 kg/m. Physical Exam Vitals and nursing note reviewed.  Constitutional:      General: She is not in acute distress.    Appearance: Normal appearance. She is  obese. She is not ill-appearing or toxic-appearing.  Eyes:     Extraocular Movements: Extraocular movements intact.     Conjunctiva/sclera: Conjunctivae normal.     Pupils: Pupils are equal, round, and reactive to light.  Neurological:     Mental Status: She is alert and oriented to person, place,  and time. Mental status is at baseline.  Psychiatric:        Mood and Affect: Mood normal.        Behavior: Behavior normal.        Thought Content: Thought content normal.        Judgment: Judgment normal.    No results found. No results found. No results found for this or any previous visit (from the past 24 hour(s)).  Assessment/Plan: Leslie Valencia is a 52 y.o. female present for OV for  Weight loss counseling, encounter for/Mixed hyperlipidemia//Class 1 obesity due to excess calories with serious comorbidity and body mass index (BMI) of 31 Patient was counseled on exercise, calorie counting, weight loss and potential medications to help with weight loss today. -Patient was provided with online resources for: Weekly net calorie calculator.  Applications for calorie counting.  Patient was advised to ensure she is taking in adequate nutrition daily by meeting calorie goals. -Patient was educated on dietary changes to not only lose weight but to eat healthy.  Patient was educated on glycemic index. -Patient was educated on exercise goal of 150 minutes a week (plus warm up and cool down) of cardiovascular exercise.  Patient was educated on heart rate for cardiovascular and fat burning zones. -Patient was encouraged to maintain adequate water consumption of at least 120 ounces a day, more if exercising/sweating. She has begun meal prepping, drinking appropriate amount of water daily and is exercising.Reginal Lutes 1.7 mg weekly injection F/u 6 weeks.    Reviewed expectations re: course of current medical issues. Discussed self-management of symptoms. Outlined signs and symptoms indicating need  for more acute intervention. Patient verbalized understanding and all questions were answered. Patient received an After-Visit Summary.    No orders of the defined types were placed in this encounter.  Meds ordered this encounter  Medications   WEGOVY 1.7 MG/0.75ML SOAJ    Sig: Inject 1.7 mg into the skin once a week.    Dispense:  3 mL    Refill:  5   rizatriptan (MAXALT) 10 MG tablet    Sig: Take 1 tablet (10 mg total) by mouth as needed for migraine. May repeat in 2 hours if needed    Dispense:  10 tablet    Refill:  5   Referral Orders  No referral(s) requested today     Note is dictated utilizing voice recognition software. Although note has been proof read prior to signing, occasional typographical errors still can be missed. If any questions arise, please do not hesitate to call for verification.   electronically signed by:  Felix Pacini, DO  [facility] - OR

## 2023-01-25 ENCOUNTER — Other Ambulatory Visit: Payer: Self-pay

## 2023-01-25 ENCOUNTER — Other Ambulatory Visit (HOSPITAL_COMMUNITY): Payer: Self-pay

## 2023-02-01 ENCOUNTER — Other Ambulatory Visit (HOSPITAL_COMMUNITY): Payer: Self-pay

## 2023-02-01 ENCOUNTER — Other Ambulatory Visit: Payer: Self-pay

## 2023-03-16 ENCOUNTER — Other Ambulatory Visit: Payer: Self-pay | Admitting: Allergy & Immunology

## 2023-03-17 ENCOUNTER — Other Ambulatory Visit: Payer: Self-pay

## 2023-03-25 ENCOUNTER — Other Ambulatory Visit (HOSPITAL_COMMUNITY): Payer: Self-pay

## 2023-03-27 ENCOUNTER — Other Ambulatory Visit: Payer: Self-pay | Admitting: Allergy & Immunology

## 2023-03-27 ENCOUNTER — Other Ambulatory Visit: Payer: Self-pay

## 2023-03-27 ENCOUNTER — Other Ambulatory Visit: Payer: Self-pay | Admitting: Family Medicine

## 2023-03-27 DIAGNOSIS — G43709 Chronic migraine without aura, not intractable, without status migrainosus: Secondary | ICD-10-CM

## 2023-03-27 DIAGNOSIS — K219 Gastro-esophageal reflux disease without esophagitis: Secondary | ICD-10-CM

## 2023-03-27 MED ORDER — LEVOCETIRIZINE DIHYDROCHLORIDE 5 MG PO TABS
5.0000 mg | ORAL_TABLET | Freq: Every evening | ORAL | 1 refills | Status: DC
  Filled 2023-03-27: qty 90, 90d supply, fill #0
  Filled 2023-06-18: qty 90, 90d supply, fill #1

## 2023-03-28 ENCOUNTER — Other Ambulatory Visit: Payer: Self-pay

## 2023-03-29 ENCOUNTER — Other Ambulatory Visit (HOSPITAL_COMMUNITY): Payer: Self-pay

## 2023-04-11 ENCOUNTER — Other Ambulatory Visit: Payer: Self-pay | Admitting: Family Medicine

## 2023-04-11 DIAGNOSIS — Z1151 Encounter for screening for human papillomavirus (HPV): Secondary | ICD-10-CM | POA: Diagnosis not present

## 2023-04-11 DIAGNOSIS — Z124 Encounter for screening for malignant neoplasm of cervix: Secondary | ICD-10-CM | POA: Diagnosis not present

## 2023-04-11 DIAGNOSIS — K219 Gastro-esophageal reflux disease without esophagitis: Secondary | ICD-10-CM

## 2023-04-11 DIAGNOSIS — Z1331 Encounter for screening for depression: Secondary | ICD-10-CM | POA: Diagnosis not present

## 2023-04-11 DIAGNOSIS — G43709 Chronic migraine without aura, not intractable, without status migrainosus: Secondary | ICD-10-CM

## 2023-04-11 DIAGNOSIS — Z01419 Encounter for gynecological examination (general) (routine) without abnormal findings: Secondary | ICD-10-CM | POA: Diagnosis not present

## 2023-04-12 ENCOUNTER — Other Ambulatory Visit (HOSPITAL_COMMUNITY): Payer: Self-pay

## 2023-04-12 ENCOUNTER — Other Ambulatory Visit: Payer: Self-pay

## 2023-04-12 MED ORDER — LEVOTHYROXINE SODIUM 100 MCG PO TABS
100.0000 ug | ORAL_TABLET | Freq: Every day | ORAL | 0 refills | Status: DC
  Filled 2023-04-12: qty 30, 30d supply, fill #0

## 2023-04-12 MED ORDER — OMEPRAZOLE 20 MG PO CPDR
20.0000 mg | DELAYED_RELEASE_CAPSULE | Freq: Every day | ORAL | 0 refills | Status: DC
Start: 2023-04-12 — End: 2023-05-26
  Filled 2023-04-12: qty 30, 30d supply, fill #0

## 2023-04-12 MED ORDER — AMITRIPTYLINE HCL 50 MG PO TABS
50.0000 mg | ORAL_TABLET | Freq: Every evening | ORAL | 0 refills | Status: DC
Start: 2023-04-12 — End: 2023-05-26
  Filled 2023-04-12: qty 30, 30d supply, fill #0

## 2023-04-25 ENCOUNTER — Encounter: Payer: Commercial Managed Care - PPO | Admitting: Family Medicine

## 2023-04-27 ENCOUNTER — Encounter (INDEPENDENT_AMBULATORY_CARE_PROVIDER_SITE_OTHER): Payer: Self-pay

## 2023-05-26 ENCOUNTER — Telehealth: Payer: Self-pay | Admitting: Allergy & Immunology

## 2023-05-26 MED ORDER — LIDOCAINE VISCOUS HCL 2 % MT SOLN: 15.0000 mL | OROMUCOSAL | 5 refills | Status: DC | PRN

## 2023-05-26 NOTE — Telephone Encounter (Signed)
Patient requested refill of viscous lidocaine. Sent in Rx.

## 2023-05-29 ENCOUNTER — Other Ambulatory Visit (HOSPITAL_COMMUNITY): Payer: Self-pay

## 2023-05-29 ENCOUNTER — Telehealth (INDEPENDENT_AMBULATORY_CARE_PROVIDER_SITE_OTHER): Payer: Commercial Managed Care - PPO | Admitting: Family Medicine

## 2023-05-29 ENCOUNTER — Encounter: Payer: Self-pay | Admitting: Family Medicine

## 2023-05-29 VITALS — Wt 180.4 lb

## 2023-05-29 DIAGNOSIS — Z1231 Encounter for screening mammogram for malignant neoplasm of breast: Secondary | ICD-10-CM

## 2023-05-29 DIAGNOSIS — E890 Postprocedural hypothyroidism: Secondary | ICD-10-CM | POA: Diagnosis not present

## 2023-05-29 DIAGNOSIS — E782 Mixed hyperlipidemia: Secondary | ICD-10-CM

## 2023-05-29 DIAGNOSIS — K219 Gastro-esophageal reflux disease without esophagitis: Secondary | ICD-10-CM

## 2023-05-29 DIAGNOSIS — G43709 Chronic migraine without aura, not intractable, without status migrainosus: Secondary | ICD-10-CM

## 2023-05-29 DIAGNOSIS — R748 Abnormal levels of other serum enzymes: Secondary | ICD-10-CM | POA: Insufficient documentation

## 2023-05-29 MED ORDER — AMITRIPTYLINE HCL 50 MG PO TABS
50.0000 mg | ORAL_TABLET | Freq: Every evening | ORAL | 1 refills | Status: DC
Start: 2023-05-29 — End: 2023-08-01
  Filled 2023-05-29: qty 90, 90d supply, fill #0

## 2023-05-29 MED ORDER — OMEPRAZOLE 20 MG PO CPDR
20.0000 mg | DELAYED_RELEASE_CAPSULE | Freq: Every day | ORAL | 1 refills | Status: DC
  Filled 2023-05-29: qty 90, 90d supply, fill #0

## 2023-05-29 MED ORDER — RIZATRIPTAN BENZOATE 10 MG PO TABS
10.0000 mg | ORAL_TABLET | ORAL | 5 refills | Status: DC | PRN
  Filled 2023-05-29: qty 10, 30d supply, fill #0

## 2023-05-29 MED ORDER — LEVOTHYROXINE SODIUM 100 MCG PO TABS
100.0000 ug | ORAL_TABLET | Freq: Every day | ORAL | 1 refills | Status: DC
  Filled 2023-05-29: qty 90, 90d supply, fill #0

## 2023-05-29 MED ORDER — ATORVASTATIN CALCIUM 40 MG PO TABS
40.0000 mg | ORAL_TABLET | Freq: Every day | ORAL | 3 refills | Status: DC
Start: 2023-05-29 — End: 2023-07-27
  Filled 2023-05-29 – 2023-05-31 (×2): qty 90, 90d supply, fill #0

## 2023-05-29 NOTE — Patient Instructions (Addendum)
Return in about 24 weeks (around 11/13/2023) for cpe (40 min), Routine chronic condition follow-up.        Great to see you today.  I have refilled the medication(s) we provide.   If labs were collected or images ordered, we will inform you of  results once we have received them and reviewed. We will contact you either by echart message, or telephone call.  Please give ample time to the testing facility, and our office to run,  receive and review results. Please do not call inquiring of results, even if you can see them in your chart. We will contact you as soon as we are able. If it has been over 1 week since the test was completed, and you have not yet heard from [place], then please call us.    - echart message- for normal results that have been seen by the patient already.   - telephone call: abnormal results or if patient has not viewed results in their echart.  If a referral to a specialist was entered for you, please call us in 2 weeks if you have not heard from the specialist office to schedule.

## 2023-05-29 NOTE — Progress Notes (Signed)
[name], female  DOB: [birth year], 51 y.o.   MRN: [id number] Patient Care Team    Relationship Specialty Notifications Start End  Natalia Leatherwood, DO PCP - General Family Medicine  04/12/18   Venita Sheffield, MD Referring Physician Obstetrics and Gynecology  04/12/18   Teodoro Spray, MD Referring Physician Endocrinology  04/12/18     Chief Complaint  Patient presents with   Medical Management of Chronic Issues    Subjective: Leslie Valencia is a 52 y.o.  Female  present for Chronic Conditions/illness Management All past medical history, surgical history, allergies, family history, immunizations, medications and social history were updated in the electronic medical record today. All recent labs, ED visits and hospitalizations within the last year were reviewed.    Chronic migraine without aura without status migrainosus, not intractable Stable with amitriptyline use   Mixed hyperlipidemia Compliant with lipitor. She has lost 25 lbs!!!   Postsurgical hypothyroidism Compliant w/ levo 100 mcg every day.        11/29/2022    1:06 PM 04/15/2022    3:31 PM 02/26/2021    1:12 PM 08/16/2019   10:04 AM 04/12/2018    9:57 AM  Depression screen PHQ 2/9  Decreased Interest 0 0 0 0 0  Down, Depressed, Hopeless 0 0 0 0 0  PHQ - 2 Score 0 0 0 0 0       No data to display          Immunization History  Administered Date(s) Administered   Influenza-Unspecified 06/06/2018   Moderna Covid-19 Vaccine Bivalent Booster 86yrs & up 06/21/2021   Moderna SARS-COV2 Booster Vaccination 07/31/2020   Moderna Sars-Covid-2 Vaccination 09/10/2019, 10/08/2019   Tdap 08/16/2019     Past Medical History:  Diagnosis Date   Allergy    Asthma    patient denies   Bronchitis    Esophagitis    GERD (gastroesophageal reflux disease)    History of kidney stones    Hyperlipidemia    Hyperthyroidism    Migraine    Multinodular goiter    S/P total thyroidectomy 08/22/2018    Seasonal allergies    Vaginal delivery    x2   Allergies  Allergen Reactions   Morphine And Codeine Anaphylaxis   Mushroom Extract Complex Anaphylaxis   Norco [Hydrocodone-Acetaminophen] Hives    Stomach pain   Tramadol     Stomach pain    Past Surgical History:  Procedure Laterality Date   CHOLECYSTECTOMY  2005   THYROIDECTOMY  08/22/2018   THYROIDECTOMY N/A 08/22/2018   Procedure: TOTAL THYROIDECTOMY;  Surgeon: Axel Filler, MD;  Location: [facility] OR;  Service: General;  Laterality: N/A;   Family History  Problem Relation Age of Onset   Migraines Mother    Alcohol abuse Father    Arthritis Father    Depression Father    Early death Father    Heart disease Maternal Grandmother    Hyperlipidemia Maternal Grandmother    Social History   Social History Narrative   Marital status/children/pets: married, 2 children.    Education/employment: BSN   Safety:      -Wears a bicycle helmet riding a bike: Yes     -smoke alarm in the home:Yes     - wears seatbelt: Yes     - Feels safe in their relationships: Yes    Allergies as of 05/29/2023       Reactions   Morphine And Codeine Anaphylaxis   Mushroom  Extract Complex Anaphylaxis   Norco [hydrocodone-acetaminophen] Hives   Stomach pain   Tramadol    Stomach pain         Medication List        Accurate as of May 29, 2023  2:07 PM. If you have any questions, ask your nurse or doctor.          STOP taking these medications    Spikevax injection Generic drug: COVID-19 mRNA vaccine (Moderna, >/= 65yrs) Stopped by: Felix Pacini       TAKE these medications    amitriptyline 50 MG tablet Commonly known as: ELAVIL Take 1 tablet (50 mg total) by mouth at bedtime.   atorvastatin 40 MG tablet Commonly known as: LIPITOR Take 1 tablet (40 mg total) by mouth daily.   b complex vitamins tablet Take 1 tablet by mouth daily.   D3 + K2 DOTS PO Take by mouth.   EPINEPHrine 0.3 mg/0.3 mL Soaj  injection Commonly known as: Auvi-Q Inject 0.3 mLs (0.3 mg total) into the muscle as needed for anaphylaxis.   famotidine 20 MG tablet Commonly known as: PEPCID Take 1 tablet (20 mg total) by mouth daily.   FISH OIL PO Take by mouth.   levocetirizine 5 MG tablet Commonly known as: XYZAL Take 1 tablet (5 mg total) by mouth every evening.   levothyroxine 100 MCG tablet Commonly known as: SYNTHROID Take 1 tablet (100 mcg total) by mouth daily before breakfast.   lidocaine 2 % solution Commonly known as: XYLOCAINE Use as directed 15 mLs in the mouth or throat as needed for mouth pain.   magnesium oxide 400 (241.3 Mg) MG tablet Commonly known as: MAG-OX Take 1 tablet (400 mg total) by mouth 2 (two) times daily.   Multi-Vitamin tablet Take by mouth.   omeprazole 20 MG capsule Commonly known as: PRILOSEC Take 1 capsule (20 mg total) by mouth daily.   rizatriptan 10 MG tablet Commonly known as: Maxalt Take 1 tablet (10 mg total) by mouth as needed for migraine. May repeat in 2 hours if needed   TURMERIC PO Take by mouth.   VITAMIN C PO Take 1 tablet by mouth daily.   Wegovy 1.7 MG/0.75ML Soaj Generic drug: Semaglutide-Weight Management Inject 1.7 mg into the skin once a week. What changed: Another medication with the same name was removed. Continue taking this medication, and follow the directions you see here. Changed by: Felix Pacini        All past medical history, surgical history, allergies, family history, immunizations andmedications were updated in the EMR today and reviewed under the history and medication portions of their EMR.     No results found for this or any previous visit (from the past 2160 hour(s)).  No results found.  ROS 14 pt review of systems performed and negative (unless mentioned in an HPI)  Objective: Wt 180 lb 6.4 oz (81.8 kg)   LMP 02/11/2015 (Approximate)   BMI 29.12 kg/m  Physical Exam Vitals and nursing note reviewed.   Constitutional:      General: She is not in acute distress.    Appearance: Normal appearance. She is not toxic-appearing.  HENT:     Head: Normocephalic and atraumatic.  Eyes:     General: No scleral icterus.       Right eye: No discharge.        Left eye: No discharge.     Conjunctiva/sclera: Conjunctivae normal.  Pulmonary:     Effort: Pulmonary effort is  normal.  Musculoskeletal:     Cervical back: Normal range of motion.  Skin:    Findings: No rash.  Neurological:     Mental Status: She is alert and oriented to person, place, and time. Mental status is at baseline.  Psychiatric:        Mood and Affect: Mood normal.        Behavior: Behavior normal.        Thought Content: Thought content normal.        Judgment: Judgment normal.      No results found.  Assessment/plan: Leslie Valencia is a 52 y.o. female present for  Chronic Conditions/illness Management Mixed hyperlipidemia/obesity Stable Continue Lipitor 40 mg daily   Postsurgical hypothyroidism Stable Continue levothyroxine 100 mcg daily.  Refills will be provided and appropriate dose after results received. Tsh - future She has lost 25 lbs!!!! Wegovy helped, but no longer covered.   Chronic migraine without aura without status migrainosus, not intractable Stable Continue amitriptyline (ELAVIL) 50 MG tablet; Take 1 tablet (50 mg total) by mouth at bedtime.  Dispense: 90 tablet; Refill: 3   Gastroesophageal reflux disease without esophagitis stable - continue omeprazole 20  Return in about 24 weeks (around 11/13/2023) for cpe (40 min), Routine chronic condition follow-up.   Orders Placed This Encounter  Procedures   TSH   Meds ordered this encounter  Medications   amitriptyline (ELAVIL) 50 MG tablet    Sig: Take 1 tablet (50 mg total) by mouth at bedtime.    Dispense:  90 tablet    Refill:  1   atorvastatin (LIPITOR) 40 MG tablet    Sig: Take 1 tablet (40 mg total) by mouth daily.    Dispense:   90 tablet    Refill:  3    Second prescription sent in today.  Please use the 40 mg tab.  Higher dose prescription was an error   omeprazole (PRILOSEC) 20 MG capsule    Sig: Take 1 capsule (20 mg total) by mouth daily.    Dispense:  90 capsule    Refill:  1   rizatriptan (MAXALT) 10 MG tablet    Sig: Take 1 tablet (10 mg total) by mouth as needed for migraine. May repeat in 2 hours if needed    Dispense:  10 tablet    Refill:  5   levothyroxine (SYNTHROID) 100 MCG tablet    Sig: Take 1 tablet (100 mcg total) by mouth daily before breakfast.    Dispense:  90 tablet    Refill:  1   Referral Orders  No referral(s) requested today     Electronically signed by: Felix Pacini, DO [facility] [place]

## 2023-05-30 ENCOUNTER — Other Ambulatory Visit: Payer: Self-pay

## 2023-06-01 ENCOUNTER — Other Ambulatory Visit: Payer: Self-pay

## 2023-06-01 ENCOUNTER — Other Ambulatory Visit (HOSPITAL_BASED_OUTPATIENT_CLINIC_OR_DEPARTMENT_OTHER): Payer: Self-pay

## 2023-06-18 ENCOUNTER — Other Ambulatory Visit (HOSPITAL_COMMUNITY): Payer: Self-pay

## 2023-06-26 LAB — TSH: TSH: 0.11 — AB (ref 0.41–5.90)

## 2023-07-11 ENCOUNTER — Other Ambulatory Visit (HOSPITAL_COMMUNITY): Payer: Self-pay

## 2023-07-11 MED ORDER — NURTEC 75 MG PO TBDP
75.0000 mg | ORAL_TABLET | ORAL | 2 refills | Status: AC | PRN
Start: 2023-07-10 — End: ?
  Filled 2023-07-11 – 2023-07-25 (×2): qty 8, 30d supply, fill #0
  Filled 2024-05-14: qty 8, 30d supply, fill #1
  Filled 2024-05-31: qty 8, 30d supply, fill #2

## 2023-07-21 ENCOUNTER — Encounter: Payer: Self-pay | Admitting: Family Medicine

## 2023-07-24 ENCOUNTER — Other Ambulatory Visit: Payer: Self-pay

## 2023-07-25 ENCOUNTER — Other Ambulatory Visit (HOSPITAL_COMMUNITY): Payer: Self-pay

## 2023-07-26 NOTE — Telephone Encounter (Signed)
1.  Please add [facility] to her pharmacy list 2 May transfer Lipitor, levothyroxine, omeprazole, Maxalt in 90-day supply to [facility].  3.  Reginal Lutes is not covered by [facility] insurance any longer, to my knowledge.  Even at the [facility].  If she has evidence that they will cover now if [facility], I had be happy to call in for her.  But I would first have her check.

## 2023-07-27 ENCOUNTER — Other Ambulatory Visit: Payer: Self-pay

## 2023-07-27 MED ORDER — ATORVASTATIN CALCIUM 40 MG PO TABS: 40.0000 mg | ORAL_TABLET | Freq: Every day | ORAL | 0 refills | Status: DC

## 2023-07-27 MED ORDER — LEVOTHYROXINE SODIUM 100 MCG PO TABS: 100.0000 ug | ORAL_TABLET | Freq: Every day | ORAL | 0 refills | Status: DC

## 2023-07-27 MED ORDER — OMEPRAZOLE 20 MG PO CPDR: 20.0000 mg | DELAYED_RELEASE_CAPSULE | Freq: Every day | ORAL | 0 refills | Status: DC

## 2023-07-27 MED ORDER — RIZATRIPTAN BENZOATE 10 MG PO TABS: 10.0000 mg | ORAL_TABLET | ORAL | 5 refills | Status: DC | PRN

## 2023-07-28 NOTE — Telephone Encounter (Signed)
Please call patient and encouraged her to make an appointment.  I cannot restart Wegovy prescription without seeing her first (if insurance does cover it). I also did not perform the thyroid labs she is reporting as abnormal.  Please ask her to order these labs and get the full results.  Need to discuss any alterations in her medications at an appointment.

## 2023-07-28 NOTE — Telephone Encounter (Signed)
  Please call patient and encouraged her to make an appointment.  I cannot restart Wegovy prescription without seeing her first (if insurance does cover it). I also did not perform the thyroid labs she is reporting as abnormal.  Please ask her to order these labs and get the full results.  Need to discuss any alterations in her medications at an appointment.

## 2023-08-01 ENCOUNTER — Encounter: Payer: Self-pay | Admitting: Family Medicine

## 2023-08-01 ENCOUNTER — Telehealth (INDEPENDENT_AMBULATORY_CARE_PROVIDER_SITE_OTHER): Payer: 59 | Admitting: Family Medicine

## 2023-08-01 VITALS — BP 134/70 | Wt 172.6 lb

## 2023-08-01 DIAGNOSIS — E6609 Other obesity due to excess calories: Secondary | ICD-10-CM | POA: Diagnosis not present

## 2023-08-01 DIAGNOSIS — E782 Mixed hyperlipidemia: Secondary | ICD-10-CM

## 2023-08-01 DIAGNOSIS — E66811 Obesity, class 1: Secondary | ICD-10-CM

## 2023-08-01 DIAGNOSIS — E890 Postprocedural hypothyroidism: Secondary | ICD-10-CM | POA: Diagnosis not present

## 2023-08-01 DIAGNOSIS — Z6833 Body mass index (BMI) 33.0-33.9, adult: Secondary | ICD-10-CM

## 2023-08-01 MED ORDER — ATORVASTATIN CALCIUM 40 MG PO TABS: 40.0000 mg | ORAL_TABLET | Freq: Every day | ORAL | 1 refills | Status: DC

## 2023-08-01 MED ORDER — AMITRIPTYLINE HCL 50 MG PO TABS: 50.0000 mg | ORAL_TABLET | Freq: Every evening | ORAL | 1 refills | Status: DC

## 2023-08-01 MED ORDER — WEGOVY 2.4 MG/0.75ML ~~LOC~~ SOAJ: 2.4000 mg | SUBCUTANEOUS | 1 refills | Status: DC

## 2023-08-01 MED ORDER — RIZATRIPTAN BENZOATE 10 MG PO TABS: 10.0000 mg | ORAL_TABLET | ORAL | 5 refills | Status: AC | PRN

## 2023-08-01 MED ORDER — OMEPRAZOLE 20 MG PO CPDR: 20.0000 mg | DELAYED_RELEASE_CAPSULE | Freq: Every day | ORAL | 1 refills | Status: DC

## 2023-08-01 NOTE — Progress Notes (Signed)
VIRTUAL VISIT VIA VIDEO  I connected with Leslie Valencia on 08/01/23 at  9:00 AM EST by a video enabled telemedicine application and verified that I am speaking with the correct person using two identifiers. Location patient: Home Location provider: [facility], Office Persons participating in the virtual visit: Patient, Dr. Claiborne Billings and Ivonne Andrew, CMA  I discussed the limitations of evaluation and management by telemedicine and the availability of in person appointments. The patient expressed understanding and agreed to proceed.     [name], female  DOB: [birth year], 52 y.o.   MRN: [id number] Patient Care Team    Relationship Specialty Notifications Start End  Natalia Leatherwood, DO PCP - General Family Medicine  04/12/18   Venita Sheffield, MD Referring Physician Obstetrics and Gynecology  04/12/18   Teodoro Spray, MD Referring Physician Endocrinology  04/12/18     Chief Complaint  Patient presents with   Hypothyroidism    Subjective: Leslie Valencia is a 53 y.o.  Female  present for Chronic Conditions/illness Management All past medical history, surgical history, allergies, family history, immunizations, medications and social history were updated in the electronic medical record today. All recent labs, ED visits and hospitalizations within the last year were reviewed.    Chronic migraine without aura without status migrainosus, not intractable Amitriptyline 50 mg nightly has been very helpful.  Mixed hyperlipidemia Compliant with lipitor. She has lost 25 lbs!!! Patient has been taking Wegovy by another provider.  She is hoping to transfer prescriptions through this provider and her [facility] pharmacy.  She will require prescription for Wegovy 2.4 weekly injection.  She has been tolerating these injections well and has seen weight loss.  She has a history of hyperlipidemia and morbid obesity.  Postsurgical hypothyroidism Compliant w/ levo 100 mcg  every day.  Recent TSH at gynecology was 0.115 weeks ago.  Patient overall was asymptomatic, possibly mild fatigue reported.      11/29/2022    1:06 PM 04/15/2022    3:31 PM 02/26/2021    1:12 PM 08/16/2019   10:04 AM 04/12/2018    9:57 AM  Depression screen PHQ 2/9  Decreased Interest 0 0 0 0 0  Down, Depressed, Hopeless 0 0 0 0 0  PHQ - 2 Score 0 0 0 0 0       No data to display          Immunization History  Administered Date(s) Administered   Influenza-Unspecified 06/06/2018, 06/13/2023   Moderna Covid-19 Vaccine Bivalent Booster 62yrs & up 06/21/2021   Moderna SARS-COV2 Booster Vaccination 07/31/2020   Moderna Sars-Covid-2 Vaccination 09/10/2019, 10/08/2019   Tdap 08/16/2019     Past Medical History:  Diagnosis Date   Allergy    Asthma    patient denies   Bronchitis    Esophagitis    GERD (gastroesophageal reflux disease)    History of kidney stones    Hyperlipidemia    Hyperthyroidism    Migraine    Multinodular goiter    S/P total thyroidectomy 08/22/2018   Seasonal allergies    Vaginal delivery    x2   Allergies  Allergen Reactions   Morphine And Codeine Anaphylaxis   Mushroom Extract Complex Anaphylaxis   Norco [Hydrocodone-Acetaminophen] Hives    Stomach pain   Tramadol     Stomach pain    Past Surgical History:  Procedure Laterality Date   CHOLECYSTECTOMY  2005   THYROIDECTOMY  08/22/2018   THYROIDECTOMY  N/A 08/22/2018   Procedure: TOTAL THYROIDECTOMY;  Surgeon: Axel Filler, MD;  Location: [facility] OR;  Service: General;  Laterality: N/A;   Family History  Problem Relation Age of Onset   Migraines Mother    Alcohol abuse Father    Arthritis Father    Depression Father    Early death Father    Heart disease Maternal Grandmother    Hyperlipidemia Maternal Grandmother    Social History   Social History Narrative   Marital status/children/pets: married, 2 children.    Education/employment: BSN   Safety:      -Wears a bicycle helmet  riding a bike: Yes     -smoke alarm in the home:Yes     - wears seatbelt: Yes     - Feels safe in their relationships: Yes    Allergies as of 08/01/2023       Reactions   Morphine And Codeine Anaphylaxis   Mushroom Extract Complex Anaphylaxis   Norco [hydrocodone-acetaminophen] Hives   Stomach pain   Tramadol    Stomach pain         Medication List        Accurate as of August 01, 2023 12:43 PM. If you have any questions, ask your nurse or doctor.          amitriptyline 50 MG tablet Commonly known as: ELAVIL Take 1 tablet (50 mg total) by mouth at bedtime.   atorvastatin 40 MG tablet Commonly known as: LIPITOR Take 1 tablet (40 mg total) by mouth daily.   b complex vitamins tablet Take 1 tablet by mouth daily.   D3 + K2 DOTS PO Take by mouth.   EPINEPHrine 0.3 mg/0.3 mL Soaj injection Commonly known as: Auvi-Q Inject 0.3 mLs (0.3 mg total) into the muscle as needed for anaphylaxis.   famotidine 20 MG tablet Commonly known as: PEPCID Take 1 tablet (20 mg total) by mouth daily.   FISH OIL PO Take by mouth.   levocetirizine 5 MG tablet Commonly known as: XYZAL Take 1 tablet (5 mg total) by mouth every evening.   levothyroxine 100 MCG tablet Commonly known as: SYNTHROID Take 1 tablet (100 mcg total) by mouth daily before breakfast.   lidocaine 2 % solution Commonly known as: XYLOCAINE Use as directed 15 mLs in the mouth or throat as needed for mouth pain.   magnesium oxide 400 (241.3 Mg) MG tablet Commonly known as: MAG-OX Take 1 tablet (400 mg total) by mouth 2 (two) times daily.   Multi-Vitamin tablet Take by mouth.   Nurtec 75 MG Tbdp Generic drug: Rimegepant Sulfate Take 1 tablet (75 mg total) by mouth as needed for migraine.   omeprazole 20 MG capsule Commonly known as: PRILOSEC Take 1 capsule (20 mg total) by mouth daily.   rizatriptan 10 MG tablet Commonly known as: Maxalt Take 1 tablet (10 mg total) by mouth as needed for  migraine. May repeat in 2 hours if needed   TURMERIC PO Take by mouth.   VITAMIN C PO Take 1 tablet by mouth daily.   Wegovy 2.4 MG/0.75ML Soaj Generic drug: Semaglutide-Weight Management Inject 2.4 mg into the skin once a week. Started by: Felix Pacini        All past medical history, surgical history, allergies, family history, immunizations andmedications were updated in the EMR today and reviewed under the history and medication portions of their EMR.     Recent Results (from the past 2160 hour(s))  TSH     Status: Abnormal  Collection Time: 06/26/23 12:00 AM  Result Value Ref Range   TSH 0.11 (A) 0.41 - 5.90    No results found.  ROS 14 pt review of systems performed and negative (unless mentioned in an HPI)  Objective: BP 134/70   Wt 172 lb 9.6 oz (78.3 kg)   LMP 02/11/2015 (Approximate)   BMI 27.86 kg/m  Physical Exam Vitals and nursing note reviewed.  Constitutional:      General: She is not in acute distress.    Appearance: Normal appearance. She is not toxic-appearing.  HENT:     Head: Normocephalic and atraumatic.  Eyes:     General: No scleral icterus.       Right eye: No discharge.        Left eye: No discharge.     Conjunctiva/sclera: Conjunctivae normal.  Pulmonary:     Effort: Pulmonary effort is normal.  Musculoskeletal:     Cervical back: Normal range of motion.  Skin:    Findings: No rash.  Neurological:     Mental Status: She is alert and oriented to person, place, and time. Mental status is at baseline.  Psychiatric:        Mood and Affect: Mood normal.        Behavior: Behavior normal.        Thought Content: Thought content normal.        Judgment: Judgment normal.      No results found.  Assessment/plan: Leslie Valencia is a 52 y.o. female present for  Chronic Conditions/illness Management Mixed hyperlipidemia/obesity Stable Continue Lipitor 40 mg daily She has been counseled on weight loss.  She has been taking Wegovy  by another provider.  She is currently on Wegovy 2.4 weekly injection and would like to have this called into her [facility] pharmacy, and management taken over by this provider.  Postsurgical hypothyroidism TSH, T3 free, T4 free-future labs Continue levothyroxine 100 mcg daily.  Refills will be provided and appropriate dose after results received.   Chronic migraine without aura without status migrainosus, not intractable Stable Continue amitriptyline (ELAVIL) 50 MG tablet; Take 1 tablet (50 mg total) by mouth at bedtime.  Dispense: 90 tablet; Refill: 3   Gastroesophageal reflux disease without esophagitis Stable Continue omeprazole 20  All medications called into her new pharmacy-[facility]  Return in about 24 weeks (around 01/16/2024).   Orders Placed This Encounter  Procedures   TSH   T4, free   T3, free   Meds ordered this encounter  Medications   amitriptyline (ELAVIL) 50 MG tablet    Sig: Take 1 tablet (50 mg total) by mouth at bedtime.    Dispense:  90 tablet    Refill:  1   atorvastatin (LIPITOR) 40 MG tablet    Sig: Take 1 tablet (40 mg total) by mouth daily.    Dispense:  90 tablet    Refill:  1    Second prescription sent in today.  Please use the 40 mg tab.  Higher dose prescription was an error   omeprazole (PRILOSEC) 20 MG capsule    Sig: Take 1 capsule (20 mg total) by mouth daily.    Dispense:  90 capsule    Refill:  1   rizatriptan (MAXALT) 10 MG tablet    Sig: Take 1 tablet (10 mg total) by mouth as needed for migraine. May repeat in 2 hours if needed    Dispense:  10 tablet    Refill:  5   WEGOVY 2.4  MG/0.75ML SOAJ    Sig: Inject 2.4 mg into the skin once a week.    Dispense:  9 mL    Refill:  1   Referral Orders  No referral(s) requested today     Electronically signed by: Felix Pacini, DO [facility] [place]

## 2023-08-01 NOTE — Patient Instructions (Addendum)
Lab appt only within a week for thyroid panel.  No follow-ups on file.        Great to see you today.  I have refilled the medication(s) we provide.   If labs were collected or images ordered, we will inform you of  results once we have received them and reviewed. We will contact you either by echart message, or telephone call.  Please give ample time to the testing facility, and our office to run,  receive and review results. Please do not call inquiring of results, even if you can see them in your chart. We will contact you as soon as we are able. If it has been over 1 week since the test was completed, and you have not yet heard from [place], then please call us.    - echart message- for normal results that have been seen by the patient already.   - telephone call: abnormal results or if patient has not viewed results in their echart.  If a referral to a specialist was entered for you, please call us in 2 weeks if you have not heard from the specialist office to schedule.

## 2023-08-02 ENCOUNTER — Other Ambulatory Visit: Payer: Self-pay

## 2023-08-02 ENCOUNTER — Telehealth: Payer: Self-pay

## 2023-08-02 MED ORDER — WEGOVY 2.4 MG/0.75ML ~~LOC~~ SOAJ
2.4000 mg | SUBCUTANEOUS | 1 refills | Status: AC
  Filled 2023-08-02: qty 3, 28d supply, fill #0
  Filled 2024-05-31: qty 9, 84d supply, fill #0

## 2023-08-02 NOTE — Telephone Encounter (Signed)
Spoke with pt and resent to preferred local pharmacy

## 2023-09-18 ENCOUNTER — Other Ambulatory Visit: Payer: Self-pay | Admitting: Family Medicine

## 2023-09-20 ENCOUNTER — Encounter (HOSPITAL_COMMUNITY): Payer: Self-pay

## 2023-09-20 ENCOUNTER — Other Ambulatory Visit (HOSPITAL_COMMUNITY): Payer: Self-pay

## 2023-09-21 ENCOUNTER — Other Ambulatory Visit (HOSPITAL_COMMUNITY): Payer: Self-pay

## 2023-09-22 ENCOUNTER — Telehealth: Payer: Self-pay

## 2023-09-28 ENCOUNTER — Telehealth: Payer: Self-pay

## 2023-09-28 ENCOUNTER — Other Ambulatory Visit (HOSPITAL_COMMUNITY): Payer: Self-pay

## 2023-09-28 NOTE — Telephone Encounter (Signed)
Pharmacy Patient Advocate Encounter   Received notification from CoverMyMeds that prior authorization for [facility] 2.4MG /0.75ML auto-injectors is required/requested.   Insurance verification completed.   The patient is insured through [facility] .   Prior Authorization form/request asks a question that requires your assistance. Please see the question below and advise accordingly.

## 2023-09-29 NOTE — Telephone Encounter (Signed)
This medication is a continuation Wegovy 2.4 mg weekly injection for weight loss.  Patient is not a diabetic. Patient has had labs at [facility], therefore they are not in the system. If insurance is requiring an A1c we will either need to get records from the patient/[facility] of an A1c in the past 12 months or she will have to come in for point-of-care.  I have not experienced the need of an A1c less than 6.5 the patient is on [facility] for weight loss in the past, this must be a new requirement?

## 2023-10-02 NOTE — Telephone Encounter (Signed)
LM for pt to return call to discuss.  

## 2023-10-02 NOTE — Telephone Encounter (Signed)
This is being documented in separate encounter. Signing off on duplicate encounter, for further details, please see telephone encounter dated 09/28/2023. Thank you.

## 2023-10-05 ENCOUNTER — Other Ambulatory Visit: Payer: Self-pay | Admitting: Family Medicine

## 2023-10-05 MED ORDER — LEVOTHYROXINE SODIUM 100 MCG PO TABS: 100.0000 ug | ORAL_TABLET | Freq: Every day | ORAL | 1 refills | Status: DC

## 2023-10-09 ENCOUNTER — Other Ambulatory Visit (HOSPITAL_COMMUNITY): Payer: Self-pay

## 2023-10-09 ENCOUNTER — Other Ambulatory Visit: Payer: Self-pay | Admitting: Allergy & Immunology

## 2023-10-09 ENCOUNTER — Other Ambulatory Visit: Payer: Self-pay

## 2023-10-09 MED ORDER — FAMOTIDINE 20 MG PO TABS
20.0000 mg | ORAL_TABLET | Freq: Every day | ORAL | 3 refills | Status: AC
  Filled 2023-10-09: qty 90, 90d supply, fill #0
  Filled 2024-03-01: qty 90, 90d supply, fill #1

## 2023-10-09 MED ORDER — LEVOCETIRIZINE DIHYDROCHLORIDE 5 MG PO TABS
5.0000 mg | ORAL_TABLET | Freq: Every evening | ORAL | 1 refills | Status: AC
  Filled 2023-10-09: qty 90, 90d supply, fill #0
  Filled 2024-03-01: qty 90, 90d supply, fill #1

## 2023-10-19 ENCOUNTER — Other Ambulatory Visit (HOSPITAL_COMMUNITY): Payer: Self-pay

## 2023-10-20 ENCOUNTER — Other Ambulatory Visit (HOSPITAL_COMMUNITY): Payer: Self-pay

## 2023-10-27 ENCOUNTER — Other Ambulatory Visit (HOSPITAL_COMMUNITY): Payer: Self-pay

## 2023-10-30 ENCOUNTER — Other Ambulatory Visit (HOSPITAL_COMMUNITY): Payer: Self-pay

## 2023-10-31 ENCOUNTER — Other Ambulatory Visit (HOSPITAL_COMMUNITY): Payer: Self-pay

## 2023-11-02 ENCOUNTER — Other Ambulatory Visit (HOSPITAL_COMMUNITY): Payer: Self-pay

## 2023-11-02 ENCOUNTER — Telehealth: Payer: Self-pay

## 2023-11-02 NOTE — Telephone Encounter (Signed)
Please make patient aware Reginal Lutes has been denied and is not covered by her plan her prior authorization result

## 2023-11-02 NOTE — Telephone Encounter (Signed)
Pharmacy Patient Advocate Encounter   Received notification from Patient Pharmacy that prior authorization for Wegovy 2.4mg /0.34ml is required/requested.   Insurance verification completed.   The patient is insured through [employer] .   Per test claim: PA required; PA submitted to above mentioned insurance via CoverMyMeds Key/confirmation #/EOC [id number] Status is pending

## 2023-11-02 NOTE — Telephone Encounter (Signed)
Pharmacy Patient Advocate Encounter  Received notification from [facility] that Prior Authorization for [facility] 2.4mg /0.24ml has been DENIED.  See denial reason below. No denial letter attached in CMM. Will attach denial letter to Media tab once received.   PA #/Case ID/Reference #: [id number]

## 2023-11-12 ENCOUNTER — Telehealth: Payer: Self-pay | Admitting: Allergy & Immunology

## 2023-11-12 MED ORDER — LIDOCAINE VISCOUS HCL 2 % MT SOLN
15.0000 mL | OROMUCOSAL | 11 refills | Status: AC | PRN
  Filled 2023-11-12: qty 100, 6d supply, fill #0
  Filled 2024-01-16: qty 100, 6d supply, fill #1
  Filled 2024-03-01: qty 100, 6d supply, fill #2
  Filled 2024-05-14: qty 100, 6d supply, fill #3
  Filled 2024-07-01: qty 100, 6d supply, fill #4

## 2023-11-12 NOTE — Telephone Encounter (Signed)
Refilled viscous lidocaine

## 2023-11-13 ENCOUNTER — Other Ambulatory Visit (HOSPITAL_COMMUNITY): Payer: Self-pay

## 2023-11-13 ENCOUNTER — Other Ambulatory Visit: Payer: Self-pay

## 2023-11-13 ENCOUNTER — Encounter (HOSPITAL_COMMUNITY): Payer: Self-pay

## 2023-12-05 ENCOUNTER — Other Ambulatory Visit (HOSPITAL_COMMUNITY): Payer: Self-pay

## 2023-12-06 ENCOUNTER — Other Ambulatory Visit (HOSPITAL_COMMUNITY): Payer: Self-pay

## 2023-12-06 MED ORDER — NURTEC 75 MG PO TBDP
75.0000 mg | ORAL_TABLET | ORAL | 2 refills | Status: AC | PRN
  Filled 2023-12-06: qty 8, 30d supply, fill #0
  Filled 2024-03-01: qty 8, 30d supply, fill #1
  Filled 2024-07-01: qty 8, 30d supply, fill #2

## 2023-12-14 ENCOUNTER — Other Ambulatory Visit (HOSPITAL_COMMUNITY): Payer: Self-pay

## 2023-12-15 ENCOUNTER — Other Ambulatory Visit (HOSPITAL_COMMUNITY): Payer: Self-pay

## 2023-12-18 ENCOUNTER — Other Ambulatory Visit (HOSPITAL_COMMUNITY): Payer: Self-pay

## 2023-12-20 ENCOUNTER — Other Ambulatory Visit (HOSPITAL_COMMUNITY): Payer: Self-pay

## 2023-12-21 ENCOUNTER — Other Ambulatory Visit (HOSPITAL_COMMUNITY): Payer: Self-pay

## 2023-12-21 ENCOUNTER — Other Ambulatory Visit: Payer: Self-pay

## 2023-12-26 ENCOUNTER — Other Ambulatory Visit (HOSPITAL_COMMUNITY): Payer: Self-pay

## 2024-01-16 ENCOUNTER — Other Ambulatory Visit (HOSPITAL_COMMUNITY): Payer: Self-pay

## 2024-01-27 ENCOUNTER — Other Ambulatory Visit: Payer: Self-pay | Admitting: Family Medicine

## 2024-03-01 ENCOUNTER — Other Ambulatory Visit (HOSPITAL_COMMUNITY): Payer: Self-pay

## 2024-03-12 ENCOUNTER — Other Ambulatory Visit: Payer: Self-pay | Admitting: Family Medicine

## 2024-04-02 ENCOUNTER — Other Ambulatory Visit: Payer: Self-pay | Admitting: Family Medicine

## 2024-05-15 ENCOUNTER — Other Ambulatory Visit: Payer: Self-pay

## 2024-06-03 ENCOUNTER — Other Ambulatory Visit: Payer: Self-pay

## 2024-06-05 ENCOUNTER — Other Ambulatory Visit (HOSPITAL_COMMUNITY): Payer: Self-pay

## 2024-06-05 ENCOUNTER — Other Ambulatory Visit: Payer: Self-pay

## 2024-06-06 ENCOUNTER — Encounter (HOSPITAL_COMMUNITY): Payer: Self-pay

## 2024-06-06 ENCOUNTER — Other Ambulatory Visit (HOSPITAL_COMMUNITY): Payer: Self-pay

## 2024-06-06 ENCOUNTER — Other Ambulatory Visit: Payer: Self-pay

## 2024-07-01 ENCOUNTER — Other Ambulatory Visit: Payer: Self-pay | Admitting: Allergy & Immunology

## 2024-07-01 ENCOUNTER — Other Ambulatory Visit: Payer: Self-pay

## 2024-07-04 ENCOUNTER — Other Ambulatory Visit (HOSPITAL_COMMUNITY): Payer: Self-pay

## 2024-07-04 ENCOUNTER — Encounter (HOSPITAL_COMMUNITY): Payer: Self-pay

## 2024-07-15 ENCOUNTER — Other Ambulatory Visit (HOSPITAL_COMMUNITY): Payer: Self-pay

## 2024-07-15 ENCOUNTER — Other Ambulatory Visit: Payer: Self-pay

## 2024-07-15 MED ORDER — OMEPRAZOLE 20 MG PO CPDR
20.0000 mg | DELAYED_RELEASE_CAPSULE | Freq: Every day | ORAL | 1 refills | Status: AC | PRN
  Filled 2024-07-15: qty 90, 90d supply, fill #0

## 2024-07-15 MED ORDER — LEVOCETIRIZINE DIHYDROCHLORIDE 5 MG PO TABS
5.0000 mg | ORAL_TABLET | Freq: Every day | ORAL | 1 refills | Status: AC | PRN
  Filled 2024-07-15: qty 90, 90d supply, fill #0
